# Patient Record
Sex: Male | Born: 1975 | Race: White | Hispanic: No | Marital: Married | Smoking: Never smoker
Health system: Southern US, Community
[De-identification: ages and names within clinical notes are randomized; demographics above are authoritative.]

## PROBLEM LIST (undated history)

## (undated) DIAGNOSIS — J90 Pleural effusion, not elsewhere classified: Secondary | ICD-10-CM

## (undated) HISTORY — DX: Pleural effusion, not elsewhere classified: J90

## (undated) HISTORY — PX: VASECTOMY: SHX75

---

## 2011-01-13 ENCOUNTER — Ambulatory Visit: Payer: Self-pay | Admitting: Family Medicine

## 2011-01-14 ENCOUNTER — Ambulatory Visit (INDEPENDENT_AMBULATORY_CARE_PROVIDER_SITE_OTHER): Payer: PRIVATE HEALTH INSURANCE | Admitting: Family Medicine

## 2011-01-14 ENCOUNTER — Encounter: Payer: Self-pay | Admitting: Family Medicine

## 2011-01-14 VITALS — BP 112/73 | Ht 74.0 in | Wt 215.0 lb

## 2011-01-14 DIAGNOSIS — M545 Low back pain: Secondary | ICD-10-CM

## 2011-01-14 NOTE — Patient Instructions (Signed)
Your exam is consistent with iliopsoas and lumbar spasms/strain Take tylenol for baseline pain relief (1-2 extra strength tabs 3x/day) Aleve 2 tablets twice a day with food as needed for pain and inflammation (if you do not have stomach or kidney issues). Stay as active as possible. Start physical therapy and go up to 6 weeks - ok to transition to home exercises sooner. Do exercises and stretches every day for 6 weeks then step down to doing them 3 times a week. Can consider massage, chiropractor and/or acupuncture but these have mixed results. Strengthening of low back muscles, abdominal musculature are key for long term pain relief.

## 2011-01-14 NOTE — Assessment & Plan Note (Signed)
2/2 iliopsoas spasms/strain and lumbar strain, less likely lumbar radiculopathy.  Symptoms much better currently than they were when this started 1 week ago.  Has never done PT - discussed this is the most important part of treatment to learn strengthening and stretching exercises to do daily, work on core strengthening, iliopsoas strengthening, and hamstring flexibility.  Naproxen for pain and inflammation, heat as needed.  See instructions for further.

## 2011-01-14 NOTE — Progress Notes (Signed)
  Subjective:    Patient ID: [name], male    DOB: 06-25-1975, 35 y.o.   MRN: [id number]  PCP: Larita Fife  HPI 35 yo M here for low back pain.  Patient reports having intermittent issues with low back past 7 years. No known injury to start all this. 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 Pain would flare up and resolve, seemed only brought about by prolonged standing at the time. 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 Then reports 1 week ago when getting up from a seated position felt a sharp pain in low back, deep in abdomen and in groin. Pain caused him to be bed ridden for 2 days. No numbness or tingling. No bowel or bladder dysfunction. Pain was worse with movement. Reproduced with back motions and felt this with doing iliopsoas stretches. Sitting for prolonged periods worsens pain in groin. No swelling or bruising. Had x-rays when in medical school and was told he had a '35 year old back' but physician did not go into specifics. Taking some nsaids for pain.  History reviewed. No pertinent past medical history.  No current outpatient prescriptions on file prior to visit.    History reviewed. No pertinent past surgical history.  No Known Allergies  History   Social History  . Marital Status: Married    Spouse Name: N/A    Number of Children: N/A  . Years of Education: N/A   Occupational History  . Not on file.   Social History Main Topics  . Smoking status: Never Smoker   . Smokeless tobacco: Not on file  . Alcohol Use: Not on file  . Drug Use: Not on file  . Sexually Active: Not on file   Other Topics Concern  . Not on file   Social History Narrative  . No narrative on file    Family History  Problem Relation Age of Onset  . Sudden death Neg Hx   . Heart attack Neg Hx   . Hyperlipidemia Neg Hx   . Hypertension Neg Hx   . Diabetes  Neg Hx     BP 112/73  Ht 6\' 2"  (1.88 m)  Wt 215 lb (97.523 kg)  BMI 27.60 kg/m2  Review of Systems See HPI above.    Objective:   Physical Exam Gen: NAD Back: No gross deformity, scoliosis.  Poor hamstring flexibility. TTP right lumbar paraspinal region.  No midline or bony TTP. FROM but can only flex to 30 degrees before getting pain in right low back, feeling uncomfortable. Strength LEs 5/5 all muscle groups except 5-/5 strength right hip flexion beyond 90 degrees.   2+ MSRs in patellar and achilles tendons, equal bilaterally. Negative SLRs. Sensation intact to light touch bilaterally. Negative logroll bilateral hips Negative fabers and piriformis stretches.    Assessment & Plan:  1. Low back pain - 2/2 iliopsoas spasms/strain and lumbar strain, less likely lumbar radiculopathy.  Symptoms much better currently than they were when this started 1 week ago.  Has never done PT - discussed this is the most important part of treatment to learn strengthening and stretching exercises to do daily, work on core strengthening, iliopsoas strengthening, and hamstring flexibility.  Naproxen for pain and inflammation, heat as needed.  See instructions for further.

## 2011-01-29 ENCOUNTER — Ambulatory Visit: Payer: PRIVATE HEALTH INSURANCE | Admitting: Physical Therapy

## 2013-09-22 ENCOUNTER — Ambulatory Visit (HOSPITAL_COMMUNITY)
Admission: RE | Admit: 2013-09-22 | Discharge: 2013-09-22 | Disposition: A | Discharge status: Home / Self Care | Payer: PRIVATE HEALTH INSURANCE | Source: Ambulatory Visit | Attending: Emergency Medicine | Admitting: Emergency Medicine

## 2013-09-22 ENCOUNTER — Emergency Department (HOSPITAL_COMMUNITY)
Admission: EM | Admit: 2013-09-22 | Discharge: 2013-09-22 | Discharge status: Against Medical Advice | Payer: PRIVATE HEALTH INSURANCE

## 2013-09-22 ENCOUNTER — Other Ambulatory Visit (HOSPITAL_COMMUNITY): Payer: Self-pay | Admitting: Emergency Medicine

## 2013-09-22 DIAGNOSIS — R05 Cough: Secondary | ICD-10-CM

## 2013-09-22 DIAGNOSIS — R059 Cough, unspecified: Secondary | ICD-10-CM

## 2013-09-22 DIAGNOSIS — R079 Chest pain, unspecified: Secondary | ICD-10-CM

## 2013-10-20 ENCOUNTER — Emergency Department (HOSPITAL_BASED_OUTPATIENT_CLINIC_OR_DEPARTMENT_OTHER): Payer: PRIVATE HEALTH INSURANCE

## 2013-10-20 ENCOUNTER — Encounter (HOSPITAL_BASED_OUTPATIENT_CLINIC_OR_DEPARTMENT_OTHER): Payer: Self-pay | Admitting: Emergency Medicine

## 2013-10-20 ENCOUNTER — Emergency Department (HOSPITAL_BASED_OUTPATIENT_CLINIC_OR_DEPARTMENT_OTHER)
Admission: EM | Admit: 2013-10-20 | Discharge: 2013-10-20 | Disposition: A | Discharge status: Home / Self Care | Payer: PRIVATE HEALTH INSURANCE | Attending: Emergency Medicine | Admitting: Emergency Medicine

## 2013-10-20 DIAGNOSIS — J90 Pleural effusion, not elsewhere classified: Secondary | ICD-10-CM | POA: Insufficient documentation

## 2013-10-20 LAB — HEPATIC FUNCTION PANEL
ALBUMIN: 3.6 g/dL (ref 3.5–5.2)
ALT: 18 U/L (ref 0–53)
AST: 17 U/L (ref 0–37)
Alkaline Phosphatase: 79 U/L (ref 39–117)
BILIRUBIN TOTAL: 0.6 mg/dL (ref 0.3–1.2)
Bilirubin, Direct: <0.2 mg/dL (ref 0.0–0.3)
TOTAL PROTEIN: 7.7 g/dL (ref 6.0–8.3)

## 2013-10-20 LAB — TROPONIN I

## 2013-10-20 LAB — CBC
HEMATOCRIT: 40.8 % (ref 39.0–52.0)
Hemoglobin: 13.9 g/dL (ref 13.0–17.0)
MCH: 30.1 pg (ref 26.0–34.0)
MCHC: 34.1 g/dL (ref 30.0–36.0)
MCV: 88.3 fL (ref 78.0–100.0)
PLATELETS: 255 10*3/uL (ref 150–400)
RBC: 4.62 MIL/uL (ref 4.22–5.81)
RDW: 11.8 % (ref 11.5–15.5)
WBC: 7.5 10*3/uL (ref 4.0–10.5)

## 2013-10-20 LAB — CK: Total CK: 70 U/L (ref 7–232)

## 2013-10-20 LAB — BASIC METABOLIC PANEL
BUN: 20 mg/dL (ref 6–23)
CHLORIDE: 101 meq/L (ref 96–112)
CO2: 25 meq/L (ref 19–32)
CREATININE: 1 mg/dL (ref 0.50–1.35)
Calcium: 9.5 mg/dL (ref 8.4–10.5)
GFR calc Af Amer: >90 mL/min (ref >90)
GFR calc non Af Amer: >90 mL/min (ref >90)
Glucose, Bld: 144 mg/dL — ABNORMAL HIGH (ref 70–99)
POTASSIUM: 4.1 meq/L (ref 3.7–5.3)
Sodium: 139 mEq/L (ref 137–147)

## 2013-10-20 LAB — D-DIMER, QUANTITATIVE: D-Dimer, Quant: 3.19 ug/mL-FEU — ABNORMAL HIGH (ref 0.00–0.48)

## 2013-10-20 MED ORDER — SODIUM CHLORIDE 0.9 % IV BOLUS (SEPSIS)
1000.0000 mL | Freq: Once | INTRAVENOUS | Status: AC
  Administered 2013-10-20: 1000 mL via INTRAVENOUS

## 2013-10-20 MED ORDER — ASPIRIN 81 MG PO CHEW
324.0000 mg | CHEWABLE_TABLET | Freq: Once | ORAL | Status: DC
  Filled 2013-10-20: qty 4

## 2013-10-20 MED ORDER — IOHEXOL 350 MG/ML SOLN
100.0000 mL | Freq: Once | INTRAVENOUS | Status: AC | PRN
  Administered 2013-10-20: 100 mL via INTRAVENOUS

## 2013-10-20 NOTE — ED Notes (Signed)
Pt brought his own EKG

## 2013-10-20 NOTE — ED Notes (Signed)
Room 873 was assigned to Patient-- transferred to [facility] by wife--- POV

## 2013-10-20 NOTE — ED Notes (Signed)
The doctor will call back on [phone]

## 2013-10-20 NOTE — ED Provider Notes (Signed)
CSN: [id number]     Arrival date & time 10/20/13  0909 History   First MD Initiated Contact with Patient 10/20/13 [phone]     Chief Complaint  Patient presents with  . Chest Pain     (Consider location/radiation/quality/duration/timing/severity/associated sxs/prior Treatment) HPI Comments: Patient is a 38 year old male with no significant past medical history who presents with a one-month history of pleuritic, right-sided chest pain. The pain is sharp in nature and causes him to feel short of breath. He has no prior cardiac history and actually completed an Ironman competition nearly 3 weeks ago. His pain had started prior to this race, subsided during the time period around which the race was run, and return shortly thereafter. He states his creatinine was mildly elevated after the race and believes he may have had some degree of rhabdomyolysis.  Patient is a 38 y.o. male presenting with chest pain. The history is provided by the patient.  Chest Pain Pain location:  R chest Pain quality: sharp   Pain radiates to:  Does not radiate Pain radiates to the back: no   Pain severity:  Moderate Onset quality:  Gradual Duration:  1 month Timing:  Intermittent Progression:  Worsening Chronicity:  New Context: breathing and movement   Relieved by:  Nothing Worsened by:  Deep breathing, movement and certain positions Ineffective treatments:  None tried   No past medical history on file. No past surgical history on file. Family History  Problem Relation Age of Onset  . Sudden death Neg Hx   . Heart attack Neg Hx   . Hyperlipidemia Neg Hx   . Hypertension Neg Hx   . Diabetes Neg Hx    History  Substance Use Topics  . Smoking status: Never Smoker   . Smokeless tobacco: Not on file  . Alcohol Use: Not on file    Review of Systems  Cardiovascular: Positive for chest pain.      Allergies  Review of patient's allergies indicates no known allergies.  Home Medications   Prior to  Admission medications   Not on File   BP 131/71  Pulse 112  Temp(Src) 98.8 F (37.1 C) (Oral)  Resp 16  SpO2 97% Physical Exam  Nursing note and vitals reviewed. Constitutional: He is oriented to person, place, and time. He appears well-developed and well-nourished. No distress.  HENT:  Head: Normocephalic and atraumatic.  Mouth/Throat: Oropharynx is clear and moist.  Neck: Normal range of motion. Neck supple.  Cardiovascular: Normal rate and regular rhythm.   Pulmonary/Chest: Effort normal. No respiratory distress. He has no wheezes.  Breath sounds are slightly diminished on the right.  Abdominal: Soft. Bowel sounds are normal.  Musculoskeletal: Normal range of motion. He exhibits no edema.  Neurological: He is alert and oriented to person, place, and time.  Skin: Skin is warm and dry. He is not diaphoretic.    ED Course  Procedures (including critical care time) Labs Review Labs Reviewed  CBC  BASIC METABOLIC PANEL  TROPONIN I  D-DIMER, QUANTITATIVE  HEPATIC FUNCTION PANEL    Imaging Review No results found.   Date: 10/20/2013  Rate: 77  Rhythm: normal sinus rhythm  QRS Axis: normal  Intervals: normal  ST/T Wave abnormalities: normal  Conduction Disutrbances:none  Narrative Interpretation:   Old EKG Reviewed: none available    MDM   Final diagnoses:  None    Workup reveals an elevated d-dimer. CT angiogram the chest was obtained which revealed a large pleural effusion with mildly  enlarged hilar lymph nodes. The concern for occult malignancy was expressed by the radiologist. His recommendations were for cytology studies to be obtained by thoracentesis. As the patient's insurance is through [facility], arrangements will be made for admission there. I've spoken with Dr. Andrey Campanile who agrees to accept the patient in transfer. He will be transferred there by private auto.    Geoffery Lyons, MD 10/20/13 [phone]

## 2013-10-20 NOTE — ED Notes (Addendum)
Pt having right sided rib since May 5th.  Pain increases with movement and deep breath.  Denies N/V.  Some sweating.  Pt states he is unable to sleep.  No recent travel.

## 2013-10-24 ENCOUNTER — Ambulatory Visit (INDEPENDENT_AMBULATORY_CARE_PROVIDER_SITE_OTHER): Payer: PRIVATE HEALTH INSURANCE | Admitting: Pulmonary Disease

## 2013-10-24 ENCOUNTER — Ambulatory Visit (INDEPENDENT_AMBULATORY_CARE_PROVIDER_SITE_OTHER)
Admission: RE | Admit: 2013-10-24 | Discharge: 2013-10-24 | Disposition: A | Discharge status: Home / Self Care | Payer: PRIVATE HEALTH INSURANCE | Source: Ambulatory Visit | Attending: Pulmonary Disease | Admitting: Pulmonary Disease

## 2013-10-24 ENCOUNTER — Encounter (INDEPENDENT_AMBULATORY_CARE_PROVIDER_SITE_OTHER): Payer: Self-pay

## 2013-10-24 ENCOUNTER — Other Ambulatory Visit (INDEPENDENT_AMBULATORY_CARE_PROVIDER_SITE_OTHER): Payer: PRIVATE HEALTH INSURANCE

## 2013-10-24 ENCOUNTER — Other Ambulatory Visit: Payer: Self-pay | Admitting: Pulmonary Disease

## 2013-10-24 ENCOUNTER — Encounter: Payer: Self-pay | Admitting: Pulmonary Disease

## 2013-10-24 VITALS — BP 132/60 | HR 87 | Temp 97.5°F | Ht 74.0 in | Wt 212.6 lb

## 2013-10-24 DIAGNOSIS — J90 Pleural effusion, not elsewhere classified: Secondary | ICD-10-CM

## 2013-10-24 LAB — CBC WITH DIFFERENTIAL/PLATELET
BASOS PCT: 0 % (ref 0.0–3.0)
Basophils Absolute: 0 10*3/uL (ref 0.0–0.1)
Eosinophils Absolute: 0.1 10*3/uL (ref 0.0–0.7)
Eosinophils Relative: 1.6 % (ref 0.0–5.0)
HEMATOCRIT: 41.8 % (ref 39.0–52.0)
HEMOGLOBIN: 13.9 g/dL (ref 13.0–17.0)
LYMPHS ABS: 0.9 10*3/uL (ref 0.7–4.0)
Lymphocytes Relative: 10.1 % — ABNORMAL LOW (ref 12.0–46.0)
MCHC: 33.1 g/dL (ref 30.0–36.0)
MCV: 87.6 fl (ref 78.0–100.0)
MONO ABS: 1 10*3/uL (ref 0.1–1.0)
MONOS PCT: 11 % (ref 3.0–12.0)
NEUTROS ABS: 6.9 10*3/uL (ref 1.4–7.7)
Neutrophils Relative %: 77.3 % — ABNORMAL HIGH (ref 43.0–77.0)
PLATELETS: 380 10*3/uL (ref 150.0–400.0)
RBC: 4.78 Mil/uL (ref 4.22–5.81)
RDW: 11.8 % (ref 11.5–15.5)
WBC: 9 10*3/uL (ref 4.0–10.5)

## 2013-10-24 LAB — SEDIMENTATION RATE: Sed Rate: 81 mm/hr — ABNORMAL HIGH (ref 0–22)

## 2013-10-24 MED ORDER — LEVOFLOXACIN 500 MG PO TABS: 500.0000 mg | ORAL_TABLET | Freq: Every day | ORAL | Status: DC

## 2013-10-24 NOTE — Assessment & Plan Note (Signed)
Favor viral pleuritis but bacterial possible - sudden onset and monocytic predominance in pleural fluid fevers viral, however loculation and chills raises concern for bacterial process. Normal chest x-ray month ago and absence of leukocytosis is reassuring. Levaquin 500 mg daily x 7 days CXR today CBC with diff, ESR, ANA, RA factor today Let me know cytology results once available  Based on these results, we will repeat testing later this week as required. If he develops persistent fever or leukocytosis, we'll have to consider referral to thoracic surgery for drainage. If effusion is persistent on today's chest x-ray, can consider repeat thoracenteses.

## 2013-10-24 NOTE — Patient Instructions (Addendum)
Favor viral pleuritis but bacterial possible Levaquin 500 mg daily x 7 days CXR today CBC with diff, ESR, ANA, RA factor today Let me know cytology results once available  Based on these results, we will repeat testing later this week as required

## 2013-10-24 NOTE — Progress Notes (Signed)
Subjective:    Patient ID: [name], male    DOB: 04-17-1976, 38 y.o.   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 He was training for his first triathlon. He developed sudden onset right-sided pleuritic chest pain in the first week of May associated with a dry cough and scant amount of blood-tinged sputum. Chest x-ray on 09/22/13 did not show any infiltrate or effusion. His symptoms resolved and he was able to complete the triathlon in [place] in the last week of May. About 6 days after this, pleuritic chest pain returned and he developed some chills and night sweats.ED evaluation showed a white blood cell count of 7.5, hemoglobin of 13.9 and positive d-dimer. CT angiogram did not show any evidence of pulmonary embolism but showed a new large right-sided pleural effusion with atelectasis of the right lower lobe. He was transferred and admitted to [facility]. Further testing showed ESR of 53, he underwent thoracenteses twice. Initial was 55 mL of serous sanguinous fluid showing 5700 white cells with 77% segs and a protein of 5.0. Subsequent thoracenteses by IR removed 1 L of amber yellow fluid with 2400 white cells, 53% polys and 47% monos. Cytology is pending. CT abdomen of the pelvis showed small loculated effusion with areas of gas likely related to the recent thoracenteses procedure.  He now complains of dry cough, pain is tolerable with Motrin round-the-clock. Reports 2 episodes of chills but no feverthat was measured. He does report being QuantiFERON negative about one year ago. He denies prior history of venous thromboembolism, does report superficial thrombosis and varicosities in his left leg. No weight loss or sick contacts. He has missed a few days of work and is eager to return   Past Medical History  Diagnosis Date  . Pleural effusion     Past Surgical History  Procedure Laterality Date  . Vasectomy        2012    No Known Allergies  History   Social History  . Marital Status: Married    Spouse Name: N/A    Number of Children: N/A  . Years of Education: N/A   Occupational History  . Not on file.   Social History Main Topics  . Smoking status: Never Smoker   . Smokeless tobacco: Not on file  . Alcohol Use: No  . Drug Use: No  . Sexual Activity: Not on file   Other Topics Concern  . Not on file   Social History Narrative  . No narrative on file    Family History  Problem Relation Age of Onset  . Sudden death Neg Hx   . Heart attack Neg Hx   . Hyperlipidemia Neg Hx   . Hypertension Neg Hx   . Diabetes Neg Hx   . Breast cancer Maternal Grandmother   . Liver disease Paternal Grandfather       Review of Systems positive as above  Constitutional: negative for anorexia, fevers  Eyes: negative for irritation, redness and visual disturbance  Ears, nose, mouth, throat, and face: negative for earaches, epistaxis, nasal congestion and sore throat  Respiratory: negative for  dyspnea on exertion, sputum and wheezing  Cardiovascular: negative for pain, dyspnea, lower extremity edema, orthopnea, palpitations and syncope  Gastrointestinal: negative for abdominal pain, constipation, diarrhea, melena, nausea and vomiting  Genitourinary:negative for dysuria, frequency and hematuria  Hematologic/lymphatic: negative for bleeding, easy bruising and lymphadenopathy  Musculoskeletal:negative for arthralgias, muscle weakness  and stiff joints  Neurological: negative for coordination problems, gait problems, headaches and weakness  Endocrine: negative for diabetic symptoms including polydipsia, polyuria and weight loss     Objective:   Physical Exam  Gen. Pleasant, well-nourished, in no distress, normal affect ENT - no lesions, no post nasal drip Neck: No JVD, no thyromegaly, no carotid bruits Lungs: no use of accessory muscles, no dullness to percussion, Decreased right  infrascapular without rales or rhonchi  Cardiovascular: Rhythm regular, heart sounds  normal, no murmurs, no peripheral edema Abdomen: soft and non-tender, no hepatosplenomegaly, BS normal. Musculoskeletal: No deformities, no cyanosis or clubbing, prominent veins left thigh Neuro:  alert, non focal Skin:  Warm, no lesions/ rash       Assessment & Plan:

## 2013-10-26 ENCOUNTER — Ambulatory Visit (HOSPITAL_COMMUNITY)
Admission: RE | Admit: 2013-10-26 | Discharge: 2013-10-26 | Disposition: A | Discharge status: Home / Self Care | Payer: PRIVATE HEALTH INSURANCE | Source: Ambulatory Visit | Attending: Radiology | Admitting: Radiology

## 2013-10-26 ENCOUNTER — Ambulatory Visit (HOSPITAL_COMMUNITY)
Admission: RE | Admit: 2013-10-26 | Discharge: 2013-10-26 | Disposition: A | Discharge status: Home / Self Care | Payer: PRIVATE HEALTH INSURANCE | Source: Ambulatory Visit | Attending: Pulmonary Disease | Admitting: Pulmonary Disease

## 2013-10-26 ENCOUNTER — Other Ambulatory Visit: Payer: Self-pay | Admitting: Pulmonary Disease

## 2013-10-26 DIAGNOSIS — J90 Pleural effusion, not elsewhere classified: Secondary | ICD-10-CM

## 2013-10-26 DIAGNOSIS — R0989 Other specified symptoms and signs involving the circulatory and respiratory systems: Secondary | ICD-10-CM | POA: Insufficient documentation

## 2013-10-26 DIAGNOSIS — R0609 Other forms of dyspnea: Secondary | ICD-10-CM | POA: Insufficient documentation

## 2013-10-26 LAB — BODY FLUID CELL COUNT WITH DIFFERENTIAL
Lymphs, Fluid: 3 %
MONOCYTE-MACROPHAGE-SEROUS FLUID: 41 % — AB (ref 50–90)
NEUTROPHIL FLUID: 56 % — AB (ref 0–25)
Total Nucleated Cell Count, Fluid: 9205 cu mm — ABNORMAL HIGH (ref 0–1000)

## 2013-10-26 LAB — GLUCOSE, SEROUS FLUID: Glucose, Fluid: <20 mg/dL

## 2013-10-26 NOTE — Procedures (Signed)
Severely loculated right pleural effusion Successful US guided right thoracentesis. Yielded 7cc of hazy blood tinged fluid. Pt tolerated procedure well. No immediate complications.  Specimen was sent for labs. CXR ordered.  Brayton El PA-C 10/26/2013 4:01 PM

## 2013-10-27 ENCOUNTER — Encounter (HOSPITAL_COMMUNITY): Payer: Self-pay | Admitting: Pharmacy Technician

## 2013-10-27 ENCOUNTER — Encounter: Payer: Self-pay | Admitting: Cardiothoracic Surgery

## 2013-10-27 ENCOUNTER — Encounter (HOSPITAL_COMMUNITY): Payer: Self-pay

## 2013-10-27 ENCOUNTER — Other Ambulatory Visit: Payer: Self-pay

## 2013-10-27 ENCOUNTER — Encounter (HOSPITAL_COMMUNITY)
Admission: RE | Admit: 2013-10-27 | Discharge: 2013-10-27 | Disposition: A | Discharge status: Home / Self Care | Payer: PRIVATE HEALTH INSURANCE | Source: Ambulatory Visit | Attending: Cardiothoracic Surgery | Admitting: Cardiothoracic Surgery

## 2013-10-27 ENCOUNTER — Other Ambulatory Visit (HOSPITAL_COMMUNITY): Payer: Self-pay | Admitting: *Deleted

## 2013-10-27 ENCOUNTER — Institutional Professional Consult (permissible substitution) (INDEPENDENT_AMBULATORY_CARE_PROVIDER_SITE_OTHER): Payer: PRIVATE HEALTH INSURANCE | Admitting: Cardiothoracic Surgery

## 2013-10-27 VITALS — BP 115/72 | HR 78 | Resp 20 | Ht 74.0 in | Wt 212.0 lb

## 2013-10-27 VITALS — BP 105/71 | HR 70 | Temp 97.6°F | Resp 15 | Ht 74.0 in | Wt 208.8 lb

## 2013-10-27 DIAGNOSIS — J90 Pleural effusion, not elsewhere classified: Secondary | ICD-10-CM

## 2013-10-27 LAB — CBC
HCT: 41.4 % (ref 39.0–52.0)
Hemoglobin: 13.9 g/dL (ref 13.0–17.0)
MCH: 28.9 pg (ref 26.0–34.0)
MCHC: 33.6 g/dL (ref 30.0–36.0)
MCV: 86.1 fL (ref 78.0–100.0)
Platelets: 338 10*3/uL (ref 150–400)
RBC: 4.81 MIL/uL (ref 4.22–5.81)
RDW: 11.8 % (ref 11.5–15.5)
WBC: 8.4 10*3/uL (ref 4.0–10.5)

## 2013-10-27 LAB — TYPE AND SCREEN
ABO/RH(D): A POS
Antibody Screen: NEGATIVE

## 2013-10-27 LAB — URINALYSIS, ROUTINE W REFLEX MICROSCOPIC
Glucose, UA: NEGATIVE mg/dL
Hgb urine dipstick: NEGATIVE
Ketones, ur: NEGATIVE mg/dL
Leukocytes, UA: NEGATIVE
Nitrite: NEGATIVE
Protein, ur: NEGATIVE mg/dL
Specific Gravity, Urine: 1.028 (ref 1.005–1.030)
Urobilinogen, UA: 1 mg/dL (ref 0.0–1.0)
pH: 6 (ref 5.0–8.0)

## 2013-10-27 LAB — PATHOLOGIST SMEAR REVIEW

## 2013-10-27 LAB — BLOOD GAS, ARTERIAL
Acid-base deficit: 1.1 mmol/L (ref 0.0–2.0)
Bicarbonate: 22.7 mEq/L (ref 20.0–24.0)
Drawn by: 344381
O2 Saturation: 98.2 %
Patient temperature: 98.6
TCO2: 23.8 mmol/L (ref 0–100)
pCO2 arterial: 35.1 mmHg (ref 35.0–45.0)
pH, Arterial: 7.426 (ref 7.350–7.450)
pO2, Arterial: 92.7 mmHg (ref 80.0–100.0)

## 2013-10-27 LAB — COMPREHENSIVE METABOLIC PANEL
ALT: 14 U/L (ref 0–53)
AST: 17 U/L (ref 0–37)
Albumin: 2.9 g/dL — ABNORMAL LOW (ref 3.5–5.2)
Alkaline Phosphatase: 87 U/L (ref 39–117)
BUN: 17 mg/dL (ref 6–23)
CO2: 17 mEq/L — ABNORMAL LOW (ref 19–32)
Calcium: 9.4 mg/dL (ref 8.4–10.5)
Chloride: 102 mEq/L (ref 96–112)
Creatinine, Ser: 0.92 mg/dL (ref 0.50–1.35)
GFR calc Af Amer: >90 mL/min (ref >90)
GFR calc non Af Amer: >90 mL/min (ref >90)
Glucose, Bld: 96 mg/dL (ref 70–99)
Potassium: 4.3 mEq/L (ref 3.7–5.3)
Sodium: 138 mEq/L (ref 137–147)
Total Bilirubin: 0.3 mg/dL (ref 0.3–1.2)
Total Protein: 7.7 g/dL (ref 6.0–8.3)

## 2013-10-27 LAB — SURGICAL PCR SCREEN
MRSA, PCR: NEGATIVE
Staphylococcus aureus: NEGATIVE

## 2013-10-27 LAB — PROTIME-INR
INR: 1.2 (ref 0.00–1.49)
Prothrombin Time: 14.9 seconds (ref 11.6–15.2)

## 2013-10-27 LAB — ABO/RH: ABO/RH(D): A POS

## 2013-10-27 LAB — APTT: aPTT: 29 seconds (ref 24–37)

## 2013-10-27 MED ORDER — DEXTROSE 5 % IV SOLN
1.5000 g | INTRAVENOUS | Status: AC
  Administered 2013-10-28: 1.5 g via INTRAVENOUS
  Filled 2013-10-27: qty 1.5

## 2013-10-27 NOTE — Pre-Procedure Instructions (Signed)
[name]  10/27/2013   Your procedure is scheduled on:  Friday, October 28, 2013 at 7:30 AM.   Report to [facility] Entrance "A" Admitting Office at 5:30 AM.   Call this number if you have problems the morning of surgery: [phone]   Remember:   Do not eat food or drink liquids after midnight tonight.   Take these medicines the morning of surgery with A SIP OF WATER: Zyrtec - if needed, Zantac - if needed.   Do not wear jewelry.  Do not wear lotions, powders, or cologne. You may NOT wear deodorant.  Men may shave face and neck.  Do not bring valuables to the hospital.  [facility] is not responsible                  for any belongings or valuables.               Contacts, dentures or bridgework may not be worn into surgery.  Leave suitcase in the car. After surgery it may be brought to your room.  For patients admitted to the hospital, discharge time is determined by your                treatment team.     Special Instructions: [place] - Preparing for Surgery  Before surgery, you can play an important role.  Because skin is not sterile, your skin needs to be as free of germs as possible.  You can reduce the number of germs on you skin by washing with CHG (chlorahexidine gluconate) soap before surgery.  CHG is an antiseptic cleaner which kills germs and bonds with the skin to continue killing germs even after washing.  Please DO NOT use if you have an allergy to CHG or antibacterial soaps.  If your skin becomes reddened/irritated stop using the CHG and inform your nurse when you arrive at Short Stay.  Do not shave (including legs and underarms) for at least 48 hours prior to the first CHG shower.  You may shave your face.  Please follow these instructions carefully:   1.  Shower with CHG Soap the night before surgery and the                                morning of Surgery.  2.  If you choose to wash your hair, wash your hair first as usual with your       normal  shampoo.  3.  After you shampoo, rinse your hair and body thoroughly to remove the                      Shampoo.  4.  Use CHG as you would any other liquid soap.  You can apply chg directly       to the skin and wash gently with scrungie or a clean washcloth.  5.  Apply the CHG Soap to your body ONLY FROM THE NECK DOWN.        Do not use on open wounds or open sores.  Avoid contact with your eyes, ears, mouth and genitals (private parts).  Wash genitals (private parts) with your normal soap.  6.  Wash thoroughly, paying special attention to the area where your surgery        will be performed.  7.  Thoroughly rinse your body with warm water from the neck down.  8.  DO NOT shower/wash with your normal soap after using and rinsing off       the CHG Soap.  9.  Pat yourself dry with a clean towel.            10.  Wear clean pajamas.            11.  Place clean sheets on your bed the night of your first shower and do not        sleep with pets.  Day of Surgery  Do not apply any lotions/deodorants the morning of surgery.  Please wear clean clothes to the hospital/surgery center.     Please read over the following fact sheets that you were given: Pain Booklet, Coughing and Deep Breathing, Blood Transfusion Information, MRSA Information and Surgical Site Infection Prevention

## 2013-10-27 NOTE — Progress Notes (Signed)
Pt stated that Dr. Donata Clay told him that he would not need the CXR today, had a one view yesterday and a 2 view on the 8th.

## 2013-10-27 NOTE — Progress Notes (Signed)
PCP is Eartha InchBADGER,MICHAEL C, MD Referring Provider is Oretha MilchAlva, Rakesh V, MD  Chief Complaint  Patient presents with  . 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 The patient developed symptoms of right pleuritic chest pain, dry cough, minimal hemoptysis, shortness of breath and night sweats in early May. He was training for a Iron[place] triathlon so he had a chest x-ray which was normal and blood work which apparently was normal. He was given a course of azithromycin. [place]. He noted no injury is or aspiration of the [place] water. In the aid tentt after completing the race his creatinine was 2.5 and he was given IV fluids. He noted some dark urine 1-2 days following the race which cleared. After returning from the Ironman competition his symptoms returned and were worse. He presented to the ED with shortness of breath and a CT scan performed to rule out PE showed no PE-however he had a large complex right pleural effusion with some reactive mediastinal nodes. [facility] He had a thoracentesis which removed 1 L of amber fluid cytology negative for malignancy but with inflammatory cells. A tuberculin skin test was placed and was apparently negative. Cultures of fluid were negative. CT of the abdomen was unremarkable at the outside hospital. His inflammatory markers were elevated-sedimentation rate and C-reactive protein. No pericardial effusion. His LFTs are unremarkable. Electrolytes normal. White count normal.  He returned back home and has  had recurrent symptoms. He was seen by pulmonary medicine a repeat x-ray showed persistent effusion. An attempt at repeat thoracentesis was  unsuccessful due to the loculated nature the fluid. Thoracic surgical evaluation was requested for VATS drainage of the loculated effusion.  The patient persists in having pleuritic pain night sweats. He has not lost weight. He denies prior injuries to his chest or previous pulmonary infections. No active dental complaints No history of cardiac disease   Past Medical History  Diagnosis Date  . Pleural effusion     Past Surgical History  Procedure Laterality Date  . Vasectomy      2012    Family History  Problem Relation Age of Onset  . Sudden death Neg Hx   . Heart attack Neg Hx   . Hyperlipidemia Neg Hx   . Hypertension Neg Hx   . Diabetes Neg Hx   . Breast cancer Maternal Grandmother   . Liver disease Paternal Grandfather     Social History History  Substance Use Topics  . Smoking status: Never Smoker   . Smokeless tobacco: Not on file  . Alcohol Use: No    Current Outpatient Prescriptions  Medication Sig Dispense Refill  . cetirizine (ZYRTEC) 10 MG tablet Take 10 mg by mouth daily as needed for allergies.       Marland Kitchen. ibuprofen (ADVIL,MOTRIN) 200 MG tablet Take 200-800 mg by mouth every 6 (six) hours as needed (for pain).      Marland Kitchen. levofloxacin (LEVAQUIN) 500 MG tablet Take 1 tablet (500 mg total) by mouth daily.  7 tablet  0  . Ranitidine HCl (ZANTAC PO) Take 1 tablet by mouth daily as needed (for heart burn).       . zolpidem (AMBIEN) 10 MG tablet Take 10 mg by mouth  at bedtime as needed for sleep.       No current facility-administered medications for this visit.    No Known Allergies  Review of Systems Currently patient is completing course of Levaquin He has family history of varicose veins which he also has bilaterally No history of heavy weight training prior to his Ironman competition  No history of artificial supplements in preparation for the race   he is right-hand dominant   BP 115/72  Pulse 78  Resp 20  Ht 6\' 2"  (1.88 m)  Wt 212 lb (96.163 kg)   BMI 27.21 kg/m2  SpO2 96% Physical Exam  38 year old male anxious but in no acute distress   HCT normocephalic dentition good Neck without JVD adenopathy or mass Thorax without deformity breath sounds diminished at right base Cardiac exam regular rhythm without murmur or gallop Abdomen soft nontender without organomegaly Extremities with superficial varicosities bilaterally no tenderness no cyanosis no edema Neuro no focal motor deficit  Diagnostic Tests:  CT scan reviewed   last chest x-ray reviewed  Impression:  inflammatory loculated right pleural effusion-probable viral. Doubt TB pleural disease. Persistent symptoms despite prolonged course of oral antibiotic therapy.   Plan  We'll proceed with bronchoscopy and right VATS to clean out The pleural space and obtain tissue for cultures and pathology. I discussed the procedure in detail the patient including location the surgical incisions, the use of  general anesthesia, and expected postoperative recovery. We will request from anesthesia epidural catheter for postoperative pain management. He understands and agrees to proceed with surgery

## 2013-10-28 ENCOUNTER — Inpatient Hospital Stay (HOSPITAL_COMMUNITY)
Admission: RE | Admit: 2013-10-28 | Discharge: 2013-11-01 | DRG: 165 | Disposition: A | Discharge status: Home / Self Care | Payer: PRIVATE HEALTH INSURANCE | Source: Ambulatory Visit | Attending: Cardiothoracic Surgery | Admitting: Cardiothoracic Surgery

## 2013-10-28 ENCOUNTER — Encounter (HOSPITAL_COMMUNITY): Payer: PRIVATE HEALTH INSURANCE | Admitting: Anesthesiology

## 2013-10-28 ENCOUNTER — Encounter (HOSPITAL_BASED_OUTPATIENT_CLINIC_OR_DEPARTMENT_OTHER): Payer: Self-pay | Admitting: Anesthesiology

## 2013-10-28 ENCOUNTER — Inpatient Hospital Stay (HOSPITAL_COMMUNITY): Payer: PRIVATE HEALTH INSURANCE | Admitting: Anesthesiology

## 2013-10-28 ENCOUNTER — Encounter (HOSPITAL_COMMUNITY)
Admission: RE | Disposition: A | Discharge status: Home / Self Care | Payer: Self-pay | Source: Ambulatory Visit | Attending: Cardiothoracic Surgery

## 2013-10-28 ENCOUNTER — Inpatient Hospital Stay (HOSPITAL_COMMUNITY): Payer: PRIVATE HEALTH INSURANCE

## 2013-10-28 DIAGNOSIS — T4275XA Adverse effect of unspecified antiepileptic and sedative-hypnotic drugs, initial encounter: Secondary | ICD-10-CM | POA: Diagnosis not present

## 2013-10-28 DIAGNOSIS — J90 Pleural effusion, not elsewhere classified: Secondary | ICD-10-CM | POA: Diagnosis present

## 2013-10-28 DIAGNOSIS — J869 Pyothorax without fistula: Principal | ICD-10-CM | POA: Diagnosis present

## 2013-10-28 DIAGNOSIS — I498 Other specified cardiac arrhythmias: Secondary | ICD-10-CM | POA: Diagnosis present

## 2013-10-28 DIAGNOSIS — K5909 Other constipation: Secondary | ICD-10-CM | POA: Diagnosis not present

## 2013-10-28 DIAGNOSIS — Z01812 Encounter for preprocedural laboratory examination: Secondary | ICD-10-CM

## 2013-10-28 HISTORY — PX: VIDEO BRONCHOSCOPY: SHX5072

## 2013-10-28 HISTORY — PX: WEDGE RESECTION: SHX5070

## 2013-10-28 HISTORY — PX: VIDEO ASSISTED THORACOSCOPY (VATS)/DECORTICATION: SHX6171

## 2013-10-28 LAB — GLUCOSE, CAPILLARY: Glucose-Capillary: 112 mg/dL — ABNORMAL HIGH (ref 70–99)

## 2013-10-28 SURGERY — VIDEO ASSISTED THORACOSCOPY (VATS)/DECORTICATION
Anesthesia: Epidural | Site: Chest | Laterality: Right

## 2013-10-28 MED ORDER — MIDAZOLAM HCL 5 MG/5ML IJ SOLN
INTRAMUSCULAR | Status: DC | PRN
  Administered 2013-10-28 (×2): 1 mg via INTRAVENOUS

## 2013-10-28 MED ORDER — NEOSTIGMINE METHYLSULFATE 10 MG/10ML IV SOLN
INTRAVENOUS | Status: AC
  Filled 2013-10-28: qty 1

## 2013-10-28 MED ORDER — ONDANSETRON HCL 4 MG/2ML IJ SOLN
4.0000 mg | Freq: Four times a day (QID) | INTRAMUSCULAR | Status: DC | PRN
  Administered 2013-10-30: 4 mg via INTRAVENOUS
  Filled 2013-10-28: qty 2

## 2013-10-28 MED ORDER — GLYCOPYRROLATE 0.2 MG/ML IJ SOLN
INTRAMUSCULAR | Status: AC
  Filled 2013-10-28: qty 2

## 2013-10-28 MED ORDER — PROMETHAZINE HCL 25 MG/ML IJ SOLN
INTRAMUSCULAR | Status: AC
  Filled 2013-10-28: qty 1

## 2013-10-28 MED ORDER — VANCOMYCIN HCL IN DEXTROSE 1-5 GM/200ML-% IV SOLN: 1000.0000 mg | INTRAVENOUS | Status: DC

## 2013-10-28 MED ORDER — ROCURONIUM BROMIDE 50 MG/5ML IV SOLN
INTRAVENOUS | Status: AC
  Filled 2013-10-28: qty 1

## 2013-10-28 MED ORDER — KETOROLAC TROMETHAMINE 30 MG/ML IJ SOLN
15.0000 mg | Freq: Once | INTRAMUSCULAR | Status: AC | PRN
  Administered 2013-10-28: 30 mg via INTRAVENOUS

## 2013-10-28 MED ORDER — ONDANSETRON HCL 4 MG/2ML IJ SOLN
INTRAMUSCULAR | Status: AC
  Filled 2013-10-28: qty 2

## 2013-10-28 MED ORDER — PIPERACILLIN-TAZOBACTAM 3.375 G IVPB
3.3750 g | INTRAVENOUS | Status: DC
  Filled 2013-10-28: qty 50

## 2013-10-28 MED ORDER — LIDOCAINE HCL (CARDIAC) 20 MG/ML IV SOLN
INTRAVENOUS | Status: DC | PRN
  Administered 2013-10-28: 60 mg via INTRAVENOUS

## 2013-10-28 MED ORDER — DEXAMETHASONE SODIUM PHOSPHATE 4 MG/ML IJ SOLN
INTRAMUSCULAR | Status: AC
  Filled 2013-10-28: qty 2

## 2013-10-28 MED ORDER — SUCCINYLCHOLINE CHLORIDE 20 MG/ML IJ SOLN
INTRAMUSCULAR | Status: AC
  Filled 2013-10-28: qty 1

## 2013-10-28 MED ORDER — FENTANYL CITRATE 0.05 MG/ML IJ SOLN
25.0000 ug | INTRAMUSCULAR | Status: DC | PRN
  Administered 2013-10-28: 50 ug via INTRAVENOUS
  Filled 2013-10-28: qty 2

## 2013-10-28 MED ORDER — LACTATED RINGERS IV SOLN
INTRAVENOUS | Status: DC | PRN
  Administered 2013-10-28: 07:00:00 via INTRAVENOUS

## 2013-10-28 MED ORDER — ONDANSETRON HCL 4 MG/2ML IJ SOLN
INTRAMUSCULAR | Status: DC | PRN
  Administered 2013-10-28: 4 mg via INTRAVENOUS

## 2013-10-28 MED ORDER — HYDROMORPHONE HCL PF 1 MG/ML IJ SOLN
0.2500 mg | INTRAMUSCULAR | Status: DC | PRN
  Administered 2013-10-28 (×3): 0.5 mg via INTRAVENOUS

## 2013-10-28 MED ORDER — INSULIN ASPART 100 UNIT/ML ~~LOC~~ SOLN: 0.0000 [IU] | Freq: Four times a day (QID) | SUBCUTANEOUS | Status: DC

## 2013-10-28 MED ORDER — OXYCODONE HCL 5 MG PO TABS: 5.0000 mg | ORAL_TABLET | Freq: Once | ORAL | Status: DC | PRN

## 2013-10-28 MED ORDER — ROPIVACAINE HCL 5 MG/ML IJ SOLN
INTRAMUSCULAR | Status: DC | PRN
  Administered 2013-10-28: 6 mL via EPIDURAL

## 2013-10-28 MED ORDER — LUNG SURGERY BOOK
Freq: Once | Status: AC
  Administered 2013-10-28: 20:00:00
  Filled 2013-10-28: qty 1

## 2013-10-28 MED ORDER — ROPIVACAINE HCL 2 MG/ML IJ SOLN
INTRAMUSCULAR | Status: DC | PRN
  Administered 2013-10-28: 8 mL/h via EPIDURAL

## 2013-10-28 MED ORDER — DEXTROSE-NACL 5-0.9 % IV SOLN
INTRAVENOUS | Status: DC
  Administered 2013-10-28: 125 mL/h via INTRAVENOUS
  Administered 2013-10-29: 12:00:00 via INTRAVENOUS

## 2013-10-28 MED ORDER — ACETAMINOPHEN 160 MG/5ML PO SOLN
325.0000 mg | ORAL | Status: DC | PRN
  Filled 2013-10-28: qty 20.3

## 2013-10-28 MED ORDER — LACTATED RINGERS IV SOLN
INTRAVENOUS | Status: DC | PRN
  Administered 2013-10-28 (×2): via INTRAVENOUS

## 2013-10-28 MED ORDER — PANTOPRAZOLE SODIUM 40 MG PO TBEC
40.0000 mg | DELAYED_RELEASE_TABLET | Freq: Every day | ORAL | Status: DC
  Administered 2013-10-29 – 2013-10-31 (×3): 40 mg via ORAL
  Filled 2013-10-28 (×4): qty 1

## 2013-10-28 MED ORDER — OXYCODONE HCL 5 MG/5ML PO SOLN: 5.0000 mg | Freq: Once | ORAL | Status: DC | PRN

## 2013-10-28 MED ORDER — PIPERACILLIN-TAZOBACTAM 3.375 G IVPB
3.3750 g | Freq: Three times a day (TID) | INTRAVENOUS | Status: DC
  Administered 2013-10-28 – 2013-11-01 (×11): 3.375 g via INTRAVENOUS
  Filled 2013-10-28 (×17): qty 50

## 2013-10-28 MED ORDER — DEXAMETHASONE SODIUM PHOSPHATE 4 MG/ML IJ SOLN
INTRAMUSCULAR | Status: DC | PRN
  Administered 2013-10-28: 8 mg via INTRAVENOUS

## 2013-10-28 MED ORDER — LIDOCAINE-EPINEPHRINE (PF) 1.5 %-1:200000 IJ SOLN
INTRAMUSCULAR | Status: DC | PRN
  Administered 2013-10-28: 6 mL

## 2013-10-28 MED ORDER — TRAMADOL HCL 50 MG PO TABS
50.0000 mg | ORAL_TABLET | Freq: Four times a day (QID) | ORAL | Status: DC | PRN
  Administered 2013-10-31: 50 mg via ORAL
  Filled 2013-10-28: qty 1

## 2013-10-28 MED ORDER — NEOSTIGMINE METHYLSULFATE 10 MG/10ML IV SOLN
INTRAVENOUS | Status: DC | PRN
  Administered 2013-10-28: 3 mg via INTRAVENOUS

## 2013-10-28 MED ORDER — GLYCOPYRROLATE 0.2 MG/ML IJ SOLN
INTRAMUSCULAR | Status: AC
  Filled 2013-10-28: qty 1

## 2013-10-28 MED ORDER — HYDROMORPHONE HCL PF 1 MG/ML IJ SOLN
INTRAMUSCULAR | Status: AC
  Filled 2013-10-28: qty 2

## 2013-10-28 MED ORDER — FENTANYL CITRATE 0.05 MG/ML IJ SOLN
INTRAMUSCULAR | Status: AC
  Filled 2013-10-28: qty 5

## 2013-10-28 MED ORDER — ZOLPIDEM TARTRATE 5 MG PO TABS
10.0000 mg | ORAL_TABLET | Freq: Every evening | ORAL | Status: DC | PRN
  Administered 2013-10-28 – 2013-10-31 (×2): 10 mg via ORAL
  Filled 2013-10-28 (×2): qty 2

## 2013-10-28 MED ORDER — 0.9 % SODIUM CHLORIDE (POUR BTL) OPTIME
TOPICAL | Status: DC | PRN
  Administered 2013-10-28: 1000 mL

## 2013-10-28 MED ORDER — ACETAMINOPHEN 325 MG PO TABS: 325.0000 mg | ORAL_TABLET | ORAL | Status: DC | PRN

## 2013-10-28 MED ORDER — KETOROLAC TROMETHAMINE 30 MG/ML IJ SOLN
30.0000 mg | Freq: Four times a day (QID) | INTRAMUSCULAR | Status: AC
  Administered 2013-10-28 – 2013-10-29 (×4): 30 mg via INTRAVENOUS
  Filled 2013-10-28 (×6): qty 1

## 2013-10-28 MED ORDER — LIDOCAINE HCL (CARDIAC) 20 MG/ML IV SOLN
INTRAVENOUS | Status: AC
  Filled 2013-10-28: qty 5

## 2013-10-28 MED ORDER — PROPOFOL 10 MG/ML IV BOLUS
INTRAVENOUS | Status: AC
  Filled 2013-10-28: qty 20

## 2013-10-28 MED ORDER — POTASSIUM CHLORIDE 10 MEQ/50ML IV SOLN: 10.0000 meq | Freq: Every day | INTRAVENOUS | Status: DC | PRN

## 2013-10-28 MED ORDER — PIPERACILLIN-TAZOBACTAM 3.375 G IVPB
3.3750 g | Freq: Four times a day (QID) | INTRAVENOUS | Status: DC
  Administered 2013-10-28: 3.375 g via INTRAVENOUS
  Filled 2013-10-28 (×3): qty 50

## 2013-10-28 MED ORDER — KETOROLAC TROMETHAMINE 30 MG/ML IJ SOLN
INTRAMUSCULAR | Status: AC
  Filled 2013-10-28: qty 1

## 2013-10-28 MED ORDER — ONDANSETRON HCL 4 MG/2ML IJ SOLN
4.0000 mg | Freq: Once | INTRAMUSCULAR | Status: AC | PRN
  Administered 2013-10-28: 4 mg via INTRAVENOUS

## 2013-10-28 MED ORDER — STERILE WATER FOR INJECTION IJ SOLN
INTRAMUSCULAR | Status: AC
  Filled 2013-10-28: qty 10

## 2013-10-28 MED ORDER — BISACODYL 5 MG PO TBEC
10.0000 mg | DELAYED_RELEASE_TABLET | Freq: Every day | ORAL | Status: DC
  Administered 2013-10-30 – 2013-11-01 (×3): 10 mg via ORAL
  Filled 2013-10-28 (×4): qty 2

## 2013-10-28 MED ORDER — ROCURONIUM BROMIDE 100 MG/10ML IV SOLN
INTRAVENOUS | Status: DC | PRN
  Administered 2013-10-28 (×3): 10 mg via INTRAVENOUS
  Administered 2013-10-28: 50 mg via INTRAVENOUS
  Administered 2013-10-28: 10 mg via INTRAVENOUS

## 2013-10-28 MED ORDER — OXYCODONE HCL 5 MG PO TABS: 5.0000 mg | ORAL_TABLET | ORAL | Status: DC | PRN

## 2013-10-28 MED ORDER — ARTIFICIAL TEARS OP OINT
TOPICAL_OINTMENT | OPHTHALMIC | Status: DC | PRN
  Administered 2013-10-28: 1 via OPHTHALMIC

## 2013-10-28 MED ORDER — EPHEDRINE SULFATE 50 MG/ML IJ SOLN
INTRAMUSCULAR | Status: AC
  Filled 2013-10-28: qty 1

## 2013-10-28 MED ORDER — PROPOFOL 10 MG/ML IV BOLUS
INTRAVENOUS | Status: DC | PRN
  Administered 2013-10-28: 200 mg via INTRAVENOUS

## 2013-10-28 MED ORDER — GLYCOPYRROLATE 0.2 MG/ML IJ SOLN
INTRAMUSCULAR | Status: DC | PRN
  Administered 2013-10-28: 0.2 mg via INTRAVENOUS
  Administered 2013-10-28: 0.4 mg via INTRAVENOUS

## 2013-10-28 MED ORDER — MIDAZOLAM HCL 2 MG/2ML IJ SOLN
INTRAMUSCULAR | Status: AC
  Filled 2013-10-28: qty 2

## 2013-10-28 MED ORDER — HYDROMORPHONE HCL PF 1 MG/ML IJ SOLN
0.2000 mg | INTRAMUSCULAR | Status: DC | PRN
  Administered 2013-10-29 – 2013-10-30 (×6): 0.5 mg via INTRAVENOUS
  Filled 2013-10-28 (×7): qty 1

## 2013-10-28 MED ORDER — FENTANYL CITRATE 0.05 MG/ML IJ SOLN
INTRAMUSCULAR | Status: DC | PRN
  Administered 2013-10-28: 100 ug via INTRAVENOUS
  Administered 2013-10-28 (×5): 50 ug via INTRAVENOUS

## 2013-10-28 MED ORDER — ROPIVACAINE HCL 2 MG/ML IJ SOLN
12.0000 mL/h | INTRAMUSCULAR | Status: DC
  Administered 2013-10-28 – 2013-10-30 (×4): 12 mL/h via EPIDURAL
  Filled 2013-10-28 (×10): qty 200

## 2013-10-28 MED ORDER — FENTANYL CITRATE 0.05 MG/ML IJ SOLN
50.0000 ug | INTRAMUSCULAR | Status: DC | PRN
  Administered 2013-10-29 – 2013-10-30 (×3): 50 ug via INTRAVENOUS
  Filled 2013-10-28 (×3): qty 2

## 2013-10-28 MED ORDER — VANCOMYCIN HCL IN DEXTROSE 1-5 GM/200ML-% IV SOLN
1000.0000 mg | Freq: Three times a day (TID) | INTRAVENOUS | Status: AC
  Administered 2013-10-28 – 2013-10-31 (×10): 1000 mg via INTRAVENOUS
  Filled 2013-10-28 (×12): qty 200

## 2013-10-28 MED ORDER — ACETAMINOPHEN 500 MG PO TABS
1000.0000 mg | ORAL_TABLET | Freq: Four times a day (QID) | ORAL | Status: DC
  Administered 2013-10-28 – 2013-11-01 (×9): 1000 mg via ORAL
  Filled 2013-10-28 (×18): qty 2

## 2013-10-28 MED ORDER — PROMETHAZINE HCL 25 MG/ML IJ SOLN
12.5000 mg | Freq: Once | INTRAMUSCULAR | Status: AC
  Administered 2013-10-28: 12.5 mg via INTRAVENOUS

## 2013-10-28 MED ORDER — ARTIFICIAL TEARS OP OINT
TOPICAL_OINTMENT | OPHTHALMIC | Status: AC
  Filled 2013-10-28: qty 3.5

## 2013-10-28 MED ORDER — ACETAMINOPHEN 160 MG/5ML PO SOLN
1000.0000 mg | Freq: Four times a day (QID) | ORAL | Status: DC
  Filled 2013-10-28 (×19): qty 40

## 2013-10-28 MED ORDER — SENNOSIDES-DOCUSATE SODIUM 8.6-50 MG PO TABS
1.0000 | ORAL_TABLET | Freq: Every day | ORAL | Status: DC
  Administered 2013-10-29 – 2013-10-31 (×3): 1 via ORAL
  Filled 2013-10-28 (×5): qty 1

## 2013-10-28 SURGICAL SUPPLY — 74 items
APPLICATOR TIP EXT COSEAL (VASCULAR PRODUCTS) ×4 IMPLANT
BAG DECANTER FOR FLEXI CONT (MISCELLANEOUS) IMPLANT
BLADE SURG 11 STRL SS (BLADE) ×4 IMPLANT
CANISTER SUCTION 2500CC (MISCELLANEOUS) ×4 IMPLANT
CATH KIT ON Q 5IN SLV (PAIN MANAGEMENT) IMPLANT
CATH ROBINSON RED A/P 22FR (CATHETERS) IMPLANT
CATH THORACIC 28FR (CATHETERS) IMPLANT
CATH THORACIC 36FR (CATHETERS) ×4 IMPLANT
CATH THORACIC 36FR RT ANG (CATHETERS) ×4 IMPLANT
CONT SPEC 4OZ CLIKSEAL STRL BL (MISCELLANEOUS) ×28 IMPLANT
COVER SURGICAL LIGHT HANDLE (MISCELLANEOUS) ×4 IMPLANT
DERMABOND ADVANCED (GAUZE/BANDAGES/DRESSINGS)
DERMABOND ADVANCED .7 DNX12 (GAUZE/BANDAGES/DRESSINGS) IMPLANT
DRAPE LAPAROSCOPIC ABDOMINAL (DRAPES) ×4 IMPLANT
DRAPE WARM FLUID 44X44 (DRAPE) ×4 IMPLANT
ELECT BLADE 4.0 EZ CLEAN MEGAD (MISCELLANEOUS) ×8
ELECT REM PT RETURN 9FT ADLT (ELECTROSURGICAL) ×4
ELECTRODE BLDE 4.0 EZ CLN MEGD (MISCELLANEOUS) ×4 IMPLANT
ELECTRODE REM PT RTRN 9FT ADLT (ELECTROSURGICAL) ×2 IMPLANT
GLOVE BIO SURGEON STRL SZ 6 (GLOVE) ×8 IMPLANT
GLOVE BIO SURGEON STRL SZ 6.5 (GLOVE) ×9 IMPLANT
GLOVE BIO SURGEONS STRL SZ 6.5 (GLOVE) ×3
GLOVE BIOGEL PI IND STRL 6 (GLOVE) ×4 IMPLANT
GLOVE BIOGEL PI INDICATOR 6 (GLOVE) ×4
GLOVE SURG SIGNA 7.5 PF LTX (GLOVE) ×8 IMPLANT
GOWN STRL REUS W/ TWL LRG LVL3 (GOWN DISPOSABLE) ×10 IMPLANT
GOWN STRL REUS W/TWL LRG LVL3 (GOWN DISPOSABLE) ×10
HANDLE STAPLE ENDO GIA SHORT (STAPLE) ×2
KIT BASIN OR (CUSTOM PROCEDURE TRAY) ×4 IMPLANT
KIT ROOM TURNOVER OR (KITS) ×4 IMPLANT
KIT SUCTION CATH 14FR (SUCTIONS) ×4 IMPLANT
NS IRRIG 1000ML POUR BTL (IV SOLUTION) ×8 IMPLANT
PACK CHEST (CUSTOM PROCEDURE TRAY) ×4 IMPLANT
PAD ARMBOARD 7.5X6 YLW CONV (MISCELLANEOUS) ×8 IMPLANT
RELOAD EGIA 45 MED/THCK PURPLE (STAPLE) ×4 IMPLANT
RELOAD EGIA 60 MED/THCK PURPLE (STAPLE) ×4 IMPLANT
RELOAD EGIA TRIS TAN 45 CVD (STAPLE) ×4 IMPLANT
RELOAD ENDO GIA 30 3.5 (STAPLE) ×4 IMPLANT
SEALANT SURG COSEAL 4ML (VASCULAR PRODUCTS) ×8 IMPLANT
SOLUTION ANTI FOG 6CC (MISCELLANEOUS) ×4 IMPLANT
SPONGE GAUZE 4X4 12PLY (GAUZE/BANDAGES/DRESSINGS) ×4 IMPLANT
SPONGE GAUZE 4X4 12PLY STER LF (GAUZE/BANDAGES/DRESSINGS) ×4 IMPLANT
SPONGE TONSIL 1.25 RF SGL STRG (GAUZE/BANDAGES/DRESSINGS) ×8 IMPLANT
STAPLER ENDO GIA 12MM SHORT (STAPLE) ×2 IMPLANT
SUT CHROMIC 3 0 SH 27 (SUTURE) ×8 IMPLANT
SUT ETHILON 3 0 PS 1 (SUTURE) IMPLANT
SUT PROLENE 3 0 SH DA (SUTURE) IMPLANT
SUT PROLENE 4 0 RB 1 (SUTURE)
SUT PROLENE 4-0 RB1 .5 CRCL 36 (SUTURE) IMPLANT
SUT PROLENE 6 0 C 1 30 (SUTURE) IMPLANT
SUT SILK  1 MH (SUTURE) ×6
SUT SILK 1 MH (SUTURE) ×6 IMPLANT
SUT SILK 2 0SH CR/8 30 (SUTURE) IMPLANT
SUT SILK 3 0SH CR/8 30 (SUTURE) IMPLANT
SUT VIC AB 1 CTX 18 (SUTURE) ×8 IMPLANT
SUT VIC AB 2 TP1 27 (SUTURE) ×4 IMPLANT
SUT VIC AB 2-0 CT1 27 (SUTURE) ×2
SUT VIC AB 2-0 CT1 TAPERPNT 27 (SUTURE) ×2 IMPLANT
SUT VIC AB 2-0 CT2 18 VCP726D (SUTURE) IMPLANT
SUT VIC AB 2-0 CTX 36 (SUTURE) ×4 IMPLANT
SUT VIC AB 3-0 SH 18 (SUTURE) IMPLANT
SUT VIC AB 3-0 X1 27 (SUTURE) ×4 IMPLANT
SUT VICRYL 0 UR6 27IN ABS (SUTURE) IMPLANT
SUT VICRYL 2 TP 1 (SUTURE) ×4 IMPLANT
SWAB COLLECTION DEVICE MRSA (MISCELLANEOUS) IMPLANT
SYSTEM SAHARA CHEST DRAIN ATS (WOUND CARE) ×4 IMPLANT
TAPE CLOTH SURG 4X10 WHT LF (GAUZE/BANDAGES/DRESSINGS) ×4 IMPLANT
TIP APPLICATOR SPRAY EXTEND 16 (VASCULAR PRODUCTS) IMPLANT
TOWEL OR 17X24 6PK STRL BLUE (TOWEL DISPOSABLE) ×4 IMPLANT
TOWEL OR 17X26 10 PK STRL BLUE (TOWEL DISPOSABLE) ×8 IMPLANT
TRAP SPECIMEN MUCOUS 40CC (MISCELLANEOUS) ×8 IMPLANT
TRAY FOLEY CATH 16FRSI W/METER (SET/KITS/TRAYS/PACK) ×4 IMPLANT
TUBE ANAEROBIC SPECIMEN COL (MISCELLANEOUS) IMPLANT
WATER STERILE IRR 1000ML POUR (IV SOLUTION) ×8 IMPLANT

## 2013-10-28 NOTE — Transfer of Care (Signed)
Immediate Anesthesia Transfer of Care Note  Patient: [name]  Procedure(s) Performed: Procedure(s) with comments: VIDEO ASSISTED THORACOSCOPY (VATS)/DECORTICATION (Right) - EPIDURAL ANESTHESIA PER DR VANTRIGT VIDEO BRONCHOSCOPY (N/A) WEDGE RESECTION RIGHT LOWER LOBE (Right)  Patient Location: PACU  Anesthesia Type:General  Level of Consciousness: awake and patient cooperative  Airway & Oxygen Therapy: Patient Spontanous Breathing and Patient connected to face mask oxygen  Post-op Assessment: Report given to PACU RN, Post -op Vital signs reviewed and stable and Patient moving all extremities X 4  Post vital signs: Reviewed and stable  Complications: No apparent anesthesia complications

## 2013-10-28 NOTE — Brief Op Note (Signed)
10/28/2013  10:39 AM  PATIENT:  [name]  38 y.o. male  PRE-OPERATIVE DIAGNOSIS:  (R) PLEURAL EFFUSION  POST-OPERATIVE DIAGNOSIS:  (R) PLEURAL EFFUSION, EMPYEMA  PROCEDURE:  VIDEO BRONCHOSCOPY, RIGHT VIDEO ASSISTED THORACOSCOPY (VATS), RIGHT DECORTICATION, WEDGE RLL  WEDGE RESECTION RIGHT LOWER LOBE   SURGEON:  Surgeon(s) and Role:    * Kerin PernaPeter Van Trigt, MD - Primary  PHYSICIAN ASSISTANT: Doree Fudgeonielle Aynsley Fleet PA-C  ANESTHESIA:   epidural  EBL:  Total I/O In: -  Out: 450 [Urine:350; Blood:100]  BLOOD ADMINISTERED:none  DRAINS: Two 36 French chest tubes in the right pleural space    SPECIMEN:  Source of Specimen:  Wedge RLL, pleural peel, pleural biopsies  DISPOSITION OF SPECIMEN:  Pathology, culture  COUNTS CORRECT:  YES  DICTATION: .Dragon Dictation  PLAN OF CARE: Admit to inpatient   PATIENT DISPOSITION:  PACU - hemodynamically stable.   Delay start of Pharmacological VTE agent (>24hrs) due to surgical blood loss or risk of bleeding: yes

## 2013-10-28 NOTE — Anesthesia Postprocedure Evaluation (Signed)
  Anesthesia Post-op Note  Patient: [name]  Procedure(s) Performed: Procedure(s) with comments: VIDEO ASSISTED THORACOSCOPY (VATS)/DECORTICATION (Right) - EPIDURAL ANESTHESIA PER DR VANTRIGT VIDEO BRONCHOSCOPY (N/A) WEDGE RESECTION RIGHT LOWER LOBE (Right)  Patient Location: PACU  Anesthesia Type:General and Epidural  Level of Consciousness: awake and alert   Airway and Oxygen Therapy: Patient Spontanous Breathing and Patient connected to nasal cannula oxygen  Post-op Pain: mild  Post-op Assessment: Post-op Vital signs reviewed, Patient's Cardiovascular Status Stable, Respiratory Function Stable, Patent Airway, No signs of Nausea or vomiting and Pain level controlled  Post-op Vital Signs: Reviewed and stable  Last Vitals:  Filed Vitals:   10/28/13 1700  BP:   Pulse: 54  Temp:   Resp: 16    Complications: No apparent anesthesia complications

## 2013-10-28 NOTE — Anesthesia Preprocedure Evaluation (Signed)
Anesthesia Evaluation  Patient identified by MRN, date of birth, ID band Patient awake    Reviewed: Allergy & Precautions, H&P , NPO status , Patient's Chart, lab work & pertinent test results  History of Anesthesia Complications Negative for: history of anesthetic complications  Airway Mallampati: I TM Distance: >3 FB Neck ROM: Full    Dental  (+) Teeth Intact   Pulmonary shortness of breath and with exertion,  Recurrent pleural effusion   + decreased breath sounds      Cardiovascular negative cardio ROS  Rhythm:Regular     Neuro/Psych negative neurological ROS     GI/Hepatic negative GI ROS, Neg liver ROS,   Endo/Other  negative endocrine ROS  Renal/GU negative Renal ROS     Musculoskeletal   Abdominal   Peds  Hematology negative hematology ROS (+)   Anesthesia Other Findings   Reproductive/Obstetrics                           Anesthesia Physical Anesthesia Plan  ASA: II  Anesthesia Plan: General and Epidural   Post-op Pain Management:    Induction: Intravenous  Airway Management Planned: Double Lumen EBT  Additional Equipment: Arterial line  Intra-op Plan:   Post-operative Plan: Extubation in OR  Informed Consent: I have reviewed the patients History and Physical, chart, labs and discussed the procedure including the risks, benefits and alternatives for the proposed anesthesia with the patient or authorized representative who has indicated his/her understanding and acceptance.   Dental advisory given  Plan Discussed with: CRNA and Surgeon  Anesthesia Plan Comments:         Anesthesia Quick Evaluation

## 2013-10-28 NOTE — Addendum Note (Signed)
Addendum created 10/28/13 1937 by Corky Soxhris Keondria Siever, MD   Modules edited: Orders

## 2013-10-28 NOTE — Progress Notes (Signed)
ANTIBIOTIC CONSULT NOTE - INITIAL  Pharmacy Consult for vancomycin Indication: s/p R VATS for early empyema  No Known Allergies  Patient Measurements: Height: 6\' 2"  (188 cm) Weight: 208 lb (94.348 kg) IBW/kg (Calculated) : 82.2  Vital Signs: Temp: 98.7 F (37.1 C) (06/12 1530) Temp src: Oral (06/12 1530) BP: 97/56 mmHg (06/12 1607) Pulse Rate: 48 (06/12 1821) Intake/Output from previous day:   Intake/Output from this shift:    Labs:  Recent Labs  10/27/13 1303  WBC 8.4  HGB 13.9  PLT 338  CREATININE 0.92   Estimated Creatinine Clearance: 126.6 ml/min (by C-G formula based on Cr of 0.92). No results found for this basename: VANCOTROUGH, Leodis BinetVANCOPEAK, VANCORANDOM, GENTTROUGH, GENTPEAK, GENTRANDOM, TOBRATROUGH, TOBRAPEAK, TOBRARND, AMIKACINPEAK, AMIKACINTROU, AMIKACIN,  in the last 72 hours   Microbiology: Recent Results (from the past 720 hour(s))  BODY FLUID CULTURE     Status: None   Collection Time    10/26/13  4:04 PM      Result Value Ref Range Status   Specimen Description PLEURAL   Final   Special Requests NONE   Final   Gram Stain     Final   Value: MODERATE WBC PRESENT,BOTH PMN AND MONONUCLEAR     NO ORGANISMS SEEN     Performed at [facility]   Culture     Final   Value: NO GROWTH 1 DAY     Performed at [facility]   Report Status PENDING   Incomplete  SURGICAL PCR SCREEN     Status: None   Collection Time    10/27/13  1:03 PM      Result Value Ref Range Status   MRSA, PCR NEGATIVE  NEGATIVE Final   Staphylococcus aureus NEGATIVE  NEGATIVE Final   Comment:            The Xpert SA Assay (FDA     approved for NASAL specimens     in patients over 38 years of age),     is one component of     a comprehensive surveillance     program.  Test performance has     been validated by [facility] for patients greater     than or equal to 38 year old.     It is not intended     to diagnose infection nor to     guide or monitor  treatment.    Medical History: Past Medical History  Diagnosis Date  . Pleural effusion     Medications:  Prescriptions prior to admission  Medication Sig Dispense Refill  . cetirizine (ZYRTEC) 10 MG tablet Take 10 mg by mouth daily as needed for allergies.       Marland Kitchen. ibuprofen (ADVIL,MOTRIN) 200 MG tablet Take 200-800 mg by mouth every 6 (six) hours as needed (for pain).      Marland Kitchen. levofloxacin (LEVAQUIN) 500 MG tablet Take 1 tablet (500 mg total) by mouth daily.  7 tablet  0  . Ranitidine HCl (ZANTAC PO) Take 1 tablet by mouth daily as needed (for heart burn).       . zolpidem (AMBIEN) 10 MG tablet Take 10 mg by mouth at bedtime as needed for sleep.       Assessment: 38 y/o male with a R pleural effusion s/p thoracentesis on 6/10 who presented with recurrent symptoms. He is now s/p R VATS. Pharmacy consulted to begin vancomycin for early empyema. He is also on Zosyn. He  had Zinacef 1.5 g IV preop at 07:49 and received Zosyn 3.375 g IV at 13:19. Renal function is normal, WBC are normal, and cultures are pending.   Goal of Therapy:  Vancomycin trough level 15-20 mcg/ml  Plan:  - Vancomycin 1000 mg IV q8h - Monitor renal function, clinical progress, and culture data - Vancomycin trough at steady state   [facility] LtdJennifer Coleville, [address] BoulevardPharm.D., BCPS Clinical Pharmacist Pager: [phone] 10/28/2013 7:36 PM

## 2013-10-28 NOTE — Progress Notes (Signed)
The patient was examined and preop studies reviewed. There has been no change from the prior exam and the patient is ready for surgery.   Plan bronchoscopy and Right VATS, decortication of loculated effusion on B [name]

## 2013-10-29 ENCOUNTER — Inpatient Hospital Stay (HOSPITAL_COMMUNITY): Payer: PRIVATE HEALTH INSURANCE

## 2013-10-29 ENCOUNTER — Encounter (HOSPITAL_COMMUNITY): Payer: Self-pay | Admitting: *Deleted

## 2013-10-29 LAB — BLOOD GAS, ARTERIAL
Acid-Base Excess: 1.5 mmol/L (ref 0.0–2.0)
Bicarbonate: 25.2 mEq/L — ABNORMAL HIGH (ref 20.0–24.0)
Drawn by: 41308
FIO2: 0.21 %
O2 SAT: 98.9 %
PATIENT TEMPERATURE: 98.6
PO2 ART: 86.4 mmHg (ref 80.0–100.0)
TCO2: 26.3 mmol/L (ref 0–100)
pCO2 arterial: 37.4 mmHg (ref 35.0–45.0)
pH, Arterial: 7.443 (ref 7.350–7.450)

## 2013-10-29 LAB — BASIC METABOLIC PANEL
BUN: 17 mg/dL (ref 6–23)
CALCIUM: 8.9 mg/dL (ref 8.4–10.5)
CO2: 25 mEq/L (ref 19–32)
CREATININE: 1.08 mg/dL (ref 0.50–1.35)
Chloride: 100 mEq/L (ref 96–112)
GFR, EST NON AFRICAN AMERICAN: 86 mL/min — AB (ref >90)
Glucose, Bld: 136 mg/dL — ABNORMAL HIGH (ref 70–99)
Potassium: 4.3 mEq/L (ref 3.7–5.3)
Sodium: 137 mEq/L (ref 137–147)

## 2013-10-29 LAB — CBC
HCT: 35.1 % — ABNORMAL LOW (ref 39.0–52.0)
Hemoglobin: 11.6 g/dL — ABNORMAL LOW (ref 13.0–17.0)
MCH: 28.4 pg (ref 26.0–34.0)
MCHC: 33 g/dL (ref 30.0–36.0)
MCV: 86 fL (ref 78.0–100.0)
Platelets: 335 10*3/uL (ref 150–400)
RBC: 4.08 MIL/uL — AB (ref 4.22–5.81)
RDW: 12 % (ref 11.5–15.5)
WBC: 11.3 10*3/uL — ABNORMAL HIGH (ref 4.0–10.5)

## 2013-10-29 MED ORDER — HEPARIN SODIUM (PORCINE) 5000 UNIT/ML IJ SOLN
5000.0000 [IU] | Freq: Two times a day (BID) | INTRAMUSCULAR | Status: DC
  Administered 2013-10-29 – 2013-10-31 (×6): 5000 [IU] via SUBCUTANEOUS
  Filled 2013-10-29 (×8): qty 1

## 2013-10-29 NOTE — Progress Notes (Signed)
Epidural Progress note  38 yo M POD #1 s/p right VATs. Epidural infusion running with adequate pain control overnight with intermittent narcotics. Describes 4/10 pain along right anterior axillary line adjacent to chest tubes.  Filed Vitals:   10/20/13 1204  BP: 124/71  Pulse: 72  Temp:   Resp: 20   Awake, Alert and Oriented Regular rate and rhythm Dressing clean, dry and intact. Dry heme at insertion site. No erythema or tenderness. Catheter at 15cm Normal motor upper and lower extremities  As patient to remain hospitalized for several more days and continues to have chest tubes in place we will continue epidural catheter for pain management. Will continue epidural Ropivacaine infusion at 12 ml/hr. Adjunct pain management with Tylenol, NSAIDs, and prn narcotics to achieve desired pain score. Consider prn epidural bolus to improve coverage and achieve adequate analgesia. Will continue to follow and plan for removal of catheter once chest tubes removed.   10/29/2013 4:35 PM Lanay Zinda, Cristal DeerHRISTOPHER, MD

## 2013-10-29 NOTE — Progress Notes (Signed)
1 Day Post-Op Procedure(s) (LRB): VIDEO ASSISTED THORACOSCOPY (VATS)/DECORTICATION (Right) VIDEO BRONCHOSCOPY (N/A) WEDGE RESECTION RIGHT LOWER LOBE (Right) Subjective: Postop day 1 right VATS for empyema Operative cultures-Gram stain negative for organisms, few white cells Afebrile No air leak from chest tubes minimal sero sanguinous drainage Objective: Vital signs in last 24 hours: Temp:  [98.1 F (36.7 C)-99 F (37.2 C)] 98.5 F (36.9 C) (06/13 1139) Pulse Rate:  [48-78] 57 (06/13 0748) Cardiac Rhythm:  [-] Normal sinus rhythm;Sinus bradycardia (06/13 0752) Resp:  [12-35] 20 (06/13 0748) BP: (97-134)/(42-61) 111/61 mmHg (06/13 0748) SpO2:  [91 %-98 %] 98 % (06/13 0748) Arterial Line BP: (88-139)/(43-58) 139/58 mmHg (06/13 0748)  Hemodynamic parameters for last 24 hours:   stable  Intake/Output from previous day: 06/12 0701 - 06/13 0700 In: 4450 [P.O.:720; I.V.:3175; IV Piggyback:555] Out: 2945 [Urine:2625; Blood:100; Chest Tube:220] Intake/Output this shift: Total I/O In: 730 [P.O.:480; I.V.:250] Out: -   Walking around ICU Epidural anesthesia level adequate Lungs clear No extremity tenderness Lab Results:  Recent Labs  10/27/13 1303 10/29/13 0432  WBC 8.4 11.3*  HGB 13.9 11.6*  HCT 41.4 35.1*  PLT 338 335   BMET:  Recent Labs  10/27/13 1303 10/29/13 0432  NA 138 137  K 4.3 4.3  CL 102 100  CO2 17* 25  GLUCOSE 96 136*  BUN 17 17  CREATININE 0.92 1.08  CALCIUM 9.4 8.9    PT/INR:  Recent Labs  10/27/13 1303  LABPROT 14.9  INR 1.20   ABG    Component Value Date/Time   PHART 7.443 10/29/2013 0420   HCO3 25.2* 10/29/2013 0420   TCO2 26.3 10/29/2013 0420   ACIDBASEDEF 1.1 10/27/2013 1303   O2SAT 98.9 10/29/2013 0420   CBG (last 3)   Recent Labs  10/28/13 1117  GLUCAP 112*    Assessment/Plan: S/P Procedure(s) (LRB): VIDEO ASSISTED THORACOSCOPY (VATS)/DECORTICATION (Right) VIDEO BRONCHOSCOPY (N/A) WEDGE RESECTION RIGHT LOWER LOBE  (Right) DC A-line, Foley, and reduced IV fluid rate Leave both chest tubes to drainage Continue IV antibiotics Start prophylactic regular heparin 5000 units every 12h   LOS: 1 day    VAN TRIGT III,Abeer Iversen 10/29/2013

## 2013-10-29 NOTE — Op Note (Signed)
[name]:  [name], [name]                ACCOUNT NO.:  [id number]  MEDICAL RECORD NO.:  [id number]  LOCATION:  3S08C                        FACILITY:  [facility]  PHYSICIAN:  Kerin PernaPeter Van Trigt, M.D.  DATE OF BIRTH:  [birth year]  DATE OF PROCEDURE:  10/28/2013 DATE OF DISCHARGE:                              OPERATIVE REPORT   OPERATION: 1. Video bronchoscopy. 2. Right VATS (video-assisted thoracoscopic surgery with decortication     of right lower lobe, wedge resection of posterior segment right     lower lobe.  SURGEON:  Kerin PernaPeter Van Trigt, M.D.  ASSISTANT:  Doree Fudgeonielle Zimmerman, PA-C  PREOPERATIVE DIAGNOSIS:  Loculated right pleural effusion-empyema, right lower lobe.  POSTOPERATIVE DIAGNOSIS:  Loculated right pleural effusion-empyema, right lower lobe.  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  This was very symptomatic.  [facility]. The fluid was negative for cytology and negative for culture.  He initially felt better, but then developed significant symptoms.  An attempt at ultrasound-guided thoracentesis was negative because of multiple loculated pockets of fluid.  He remained symptomatic and a VATS decortication was recommended by Pulmonary Medicine.  I examined the patient in the office and agreed with the plan for bronchoscopy and right VATS decortication for a probable entrapped lower lobe.  The patient denied history of trauma or aspiration or preceding episodes of pneumonia.  I discussed the procedure with the patient in detail including the use of general anesthesia, the location of the surgical incisions, and the expected postoperative hospital care with chest tube management as well. He understood the risks of  bleeding, recurrent infection, prolonged air leak, recurrent pleural effusion, and agreed to proceed with surgery under informed consent.  OPERATIVE PROCEDURE:  The patient was brought to the operating room and placed supine on the operating table after an epidural catheter had been placed in the preop area.  General anesthesia was induced.  Through that, a proper time-out was performed.  Through the endotracheal tube, a fiberoptic bronchoscope was passed. The distal trachea and carina were normal.  The mainstem bronchus of the right lung was normal as were the endobronchial segments of the right upper lobe, right lower lobe and right middle lobe.  Washings of the right lower lobe were taken for culture.  The bronchoscope was then passed on the left mainstem bronchus which was normal.  The endobronchial site once of the left upper lobe and left lower lobe were all visualized and were normal and completely clear.  The bronchoscope was withdrawn.  The patient was then intubated with a double-lumen tube, turned right side up and prepped and draped as a sterile field.  A proper time-out was repeated.  A small incision was made from the tip of the scapula and a VATS camera was inserted.  The pleural space was obliterated with thick adhesions with minimal visibility.  The incision was extended approximately 2 inches.  The ribs were gently spread.  Using the scope as well as  open dissection, the lower lobe was eventually mobilized.  The pockets of pleural fluid were opened and drained and there was a large amount of solid peel on the visceral pleura of the right lower lobe as well as the chest wall and diaphragm.  This was all cleaned out and the visceral pleura peel was removed to free up the lower lobe.  The posterior basilar segment of the right lower lobe was especially indurated and inflamed and this was wedged using the Endo-GIA stapler for cultures and pathologic studies.  The  pleural space was irrigated with warm saline.  Some medical adhesive- Coseal was placed over this staple line, where the lung was biopsied. The pleural space was drained with 2 chest tubes, which were brought out through separate incisions.  The lungs re-expanded under direct vision. The lungs filled the space well.  The incision was then closed using interrupted #2 Vicryl pericostals.  The muscle was closed in layers using #1 Vicryl, and the subcutaneous and skin layers were closed in running Vicryl.  Sterile dressings were applied and the chest tubes were connected to an underwater seal Pleur-Evac drainage system.  Chest x-ray in the operating room before the patient left showed evacuation of the loculated effusion with no pneumothorax.     Kerin PernaPeter Van Trigt, M.D.     PV/MEDQ  D:  10/28/2013  T:  10/29/2013  Job:  [id number]

## 2013-10-30 ENCOUNTER — Inpatient Hospital Stay (HOSPITAL_COMMUNITY): Payer: PRIVATE HEALTH INSURANCE

## 2013-10-30 LAB — BODY FLUID CULTURE: Culture: NO GROWTH

## 2013-10-30 LAB — CBC
HCT: 41 % (ref 39.0–52.0)
Hemoglobin: 13.5 g/dL (ref 13.0–17.0)
MCH: 28.8 pg (ref 26.0–34.0)
MCHC: 32.9 g/dL (ref 30.0–36.0)
MCV: 87.6 fL (ref 78.0–100.0)
PLATELETS: 416 10*3/uL — AB (ref 150–400)
RBC: 4.68 MIL/uL (ref 4.22–5.81)
RDW: 12.1 % (ref 11.5–15.5)
WBC: 8.5 10*3/uL (ref 4.0–10.5)

## 2013-10-30 LAB — CULTURE, RESPIRATORY W GRAM STAIN
Culture: NO GROWTH
Gram Stain: NONE SEEN

## 2013-10-30 LAB — COMPREHENSIVE METABOLIC PANEL
ALBUMIN: 2.7 g/dL — AB (ref 3.5–5.2)
ALT: 18 U/L (ref 0–53)
AST: 27 U/L (ref 0–37)
Alkaline Phosphatase: 79 U/L (ref 39–117)
BILIRUBIN TOTAL: 0.3 mg/dL (ref 0.3–1.2)
BUN: 11 mg/dL (ref 6–23)
CHLORIDE: 101 meq/L (ref 96–112)
CO2: 29 meq/L (ref 19–32)
Calcium: 9.3 mg/dL (ref 8.4–10.5)
Creatinine, Ser: 1.12 mg/dL (ref 0.50–1.35)
GFR calc Af Amer: >90 mL/min (ref >90)
GFR, EST NON AFRICAN AMERICAN: 82 mL/min — AB (ref >90)
Glucose, Bld: 112 mg/dL — ABNORMAL HIGH (ref 70–99)
POTASSIUM: 4.2 meq/L (ref 3.7–5.3)
SODIUM: 143 meq/L (ref 137–147)
Total Protein: 7.5 g/dL (ref 6.0–8.3)

## 2013-10-30 MED ORDER — NALOXONE HCL 0.4 MG/ML IJ SOLN: 0.4000 mg | INTRAMUSCULAR | Status: DC | PRN

## 2013-10-30 MED ORDER — DIPHENHYDRAMINE HCL 50 MG/ML IJ SOLN
12.5000 mg | Freq: Four times a day (QID) | INTRAMUSCULAR | Status: DC | PRN
Start: 2013-10-30 — End: 2013-10-31

## 2013-10-30 MED ORDER — KETOROLAC TROMETHAMINE 15 MG/ML IJ SOLN
15.0000 mg | Freq: Once | INTRAMUSCULAR | Status: AC
  Administered 2013-10-30: 15 mg via INTRAVENOUS

## 2013-10-30 MED ORDER — HYDROMORPHONE 0.3 MG/ML IV SOLN
INTRAVENOUS | Status: DC
  Administered 2013-10-30: 0.2 mg via INTRAVENOUS
  Administered 2013-10-30: 0.599 mg via INTRAVENOUS
  Administered 2013-10-30: 11:00:00 via INTRAVENOUS
  Administered 2013-10-30: 0.799 mg via INTRAVENOUS
  Administered 2013-10-31: 1.39 mg via INTRAVENOUS
  Administered 2013-10-31: 0.4 mg via INTRAVENOUS
  Administered 2013-10-31: 0.599 mg via INTRAVENOUS
  Administered 2013-10-31: 0.399 mg via INTRAVENOUS
  Administered 2013-10-31: 0.2 mg via INTRAVENOUS

## 2013-10-30 MED ORDER — ONDANSETRON HCL 4 MG/2ML IJ SOLN
4.0000 mg | Freq: Four times a day (QID) | INTRAMUSCULAR | Status: DC | PRN
  Administered 2013-10-31 – 2013-11-01 (×3): 4 mg via INTRAVENOUS
  Filled 2013-10-30 (×2): qty 2

## 2013-10-30 MED ORDER — DIPHENHYDRAMINE HCL 12.5 MG/5ML PO ELIX
12.5000 mg | ORAL_SOLUTION | Freq: Four times a day (QID) | ORAL | Status: DC | PRN
  Filled 2013-10-30: qty 5

## 2013-10-30 MED ORDER — KETOROLAC TROMETHAMINE 15 MG/ML IJ SOLN
INTRAMUSCULAR | Status: AC
  Administered 2013-10-30: 15 mg via INTRAVENOUS
  Filled 2013-10-30: qty 1

## 2013-10-30 MED ORDER — HYDROMORPHONE 0.3 MG/ML IV SOLN
INTRAVENOUS | Status: AC
  Administered 2013-10-30: 0.2 mg
  Filled 2013-10-30: qty 25

## 2013-10-30 MED ORDER — SODIUM CHLORIDE 0.9 % IJ SOLN: 9.0000 mL | INTRAMUSCULAR | Status: DC | PRN

## 2013-10-30 NOTE — Progress Notes (Signed)
Utilization Review Completed.[name] T6/14/2015  

## 2013-10-30 NOTE — Progress Notes (Addendum)
      [address]       [place] [id number]             [phone]       2 Days Post-Op Procedure(s) (LRB): VIDEO ASSISTED THORACOSCOPY (VATS)/DECORTICATION (Right) VIDEO BRONCHOSCOPY (N/A) WEDGE RESECTION RIGHT LOWER LOBE (Right)  Subjective: He walked in the hallway earlier. Is still having pain control issues.  Objective: Vital signs in last 24 hours: Temp:  [98.1 F (36.7 C)-98.6 F (37 C)] 98.2 F (36.8 C) (06/14 0751) Pulse Rate:  [52-72] 59 (06/14 0751) Cardiac Rhythm:  [-] Sinus bradycardia (06/14 0751) Resp:  [18-26] 19 (06/14 0751) BP: (96-117)/(48-62) 106/62 mmHg (06/14 0751) SpO2:  [95 %-99 %] 97 % (06/14 0751)      Intake/Output from previous day: 06/13 0701 - 06/14 0700 In: 2559.1 [P.O.:960; I.V.:849.1; IV Piggyback:750] Out: 1421 [Urine:1301; Chest Tube:120]   Physical Exam:  Cardiovascular: RRR, no murmurs, gallops, or rubs. Pulmonary: Clear to auscultation bilaterally; no rales, wheezes, or rhonchi. Abdomen: Soft, non tender, bowel sounds present. Extremities: Trace bilateral lower extremity edema. Wounds: Clean and dry.  No erythema or signs of infection. Chest Tubes: to suction and no air leak  Lab Results: CBC: Recent Labs  10/29/13 0432 10/30/13 0221  WBC 11.3* 8.5  HGB 11.6* 13.5  HCT 35.1* 41.0  PLT 335 416*   BMET:  Recent Labs  10/29/13 0432 10/30/13 0221  NA 137 143  K 4.3 4.2  CL 100 101  CO2 25 29  GLUCOSE 136* 112*  BUN 17 11  CREATININE 1.08 1.12  CALCIUM 8.9 9.3    PT/INR:  Recent Labs  10/27/13 1303  LABPROT 14.9  INR 1.20   ABG:  INR: Will add last result for INR, ABG once components are confirmed Will add last 4 CBG results once components are confirmed  Assessment/Plan:  1. CV - SR 2.  Pulmonary - Chest tubes with 175 cc last 24 hours. Chest tubes are to suction. There is no air leak. As discussed with Dr. Tyrone SageGerhardt, will remove anterior chest tube today. CXR shows no  pneumothorax, bibasilar atelectasis, right pleural effusion, and stable cardiomegaly. No malignancy on path. No AFB and no growth yet on cultures. 3. Epidural per anesthesia 4.ID- Continue Zosyn and Vanco  5. Regarding pain control, using Fentanyl PRN but does not help alleviate pain. He is hesitant to use Oxy as makes him have nausea so will discontinue. Will give Dilaudid reduced dose PCA and one ose of Toradol 15 mg (as discussed with surgeon). Try Ultram PRN as well.  [name],[name] 10/30/2013,9:16 AM  Anterior chest tube removed, very little drainage Still pain issus with epidural , pca added Watch cr, patient notes it was up to 2.2 2 months ago I have seen and examined [name] and agree with the above assessment  and plan.  Delight OvensEdward B Zeffie Bickert [name] Beeper [phone] Office [phone] 10/30/2013 11:53 AM

## 2013-10-31 ENCOUNTER — Inpatient Hospital Stay (HOSPITAL_COMMUNITY): Payer: PRIVATE HEALTH INSURANCE

## 2013-10-31 ENCOUNTER — Encounter (HOSPITAL_COMMUNITY): Payer: Self-pay | Admitting: Cardiothoracic Surgery

## 2013-10-31 LAB — TISSUE CULTURE
Culture: NO GROWTH
Culture: NO GROWTH
Culture: NO GROWTH
Gram Stain: NONE SEEN
Gram Stain: NONE SEEN
Gram Stain: NONE SEEN

## 2013-10-31 LAB — VANCOMYCIN, TROUGH: Vancomycin Tr: 13.3 ug/mL (ref 10.0–20.0)

## 2013-10-31 MED ORDER — VANCOMYCIN HCL 10 G IV SOLR
1250.0000 mg | Freq: Three times a day (TID) | INTRAVENOUS | Status: DC
  Administered 2013-11-01: 1250 mg via INTRAVENOUS
  Filled 2013-10-31 (×4): qty 1250

## 2013-10-31 MED ORDER — HYDROMORPHONE HCL 2 MG PO TABS
1.0000 mg | ORAL_TABLET | ORAL | Status: DC | PRN
  Administered 2013-10-31 (×2): 2 mg via ORAL
  Filled 2013-10-31 (×2): qty 1

## 2013-10-31 NOTE — Addendum Note (Signed)
Addendum created 10/31/13 1555 by Hart Robinsonsharles Braun Rocca, MD   Modules edited: Anesthesia LDA

## 2013-10-31 NOTE — Progress Notes (Addendum)
      [address]       [place] [id number]             [phone]       3 Days Post-Op Procedure(s) (LRB): VIDEO ASSISTED THORACOSCOPY (VATS)/DECORTICATION (Right) VIDEO BRONCHOSCOPY (N/A) WEDGE RESECTION RIGHT LOWER LOBE (Right)  Subjective: Patient states Dilaudid PCA has helped with pain.  Objective: Vital signs in last 24 hours: Temp:  [97.7 F (36.5 C)-98.7 F (37.1 C)] 97.9 F (36.6 C) (06/15 0719) Pulse Rate:  [49-69] 51 (06/15 0719) Cardiac Rhythm:  [-] Sinus bradycardia (06/15 0719) Resp:  [13-28] 13 (06/15 0719) BP: (103-116)/(53-57) 105/56 mmHg (06/15 0719) SpO2:  [94 %-98 %] 96 % (06/15 0719)      Intake/Output from previous day: 06/14 0701 - 06/15 0700 In: 998.4 [I.V.:248.4; IV Piggyback:750] Out: 1490 [Urine:1450; Chest Tube:40]   Physical Exam:  Cardiovascular: RRR Pulmonary: Mostly clear to auscultation bilaterally; no rales, wheezes, or rhonchi. Abdomen: Soft, non tender, bowel sounds present. Extremities: Trace bilateral lower extremity edema. Wounds: Clean and dry.  No erythema or signs of infection. Chest Tube: to water seal and no air leak  Lab Results: CBC:  Recent Labs  10/29/13 0432 10/30/13 0221  WBC 11.3* 8.5  HGB 11.6* 13.5  HCT 35.1* 41.0  PLT 335 416*   BMET:   Recent Labs  10/29/13 0432 10/30/13 0221  NA 137 143  K 4.3 4.2  CL 100 101  CO2 25 29  GLUCOSE 136* 112*  BUN 17 11  CREATININE 1.08 1.12  CALCIUM 8.9 9.3    PT/INR: No results found for this basename: LABPROT, INR,  in the last 72 hours ABG:  INR: Will add last result for INR, ABG once components are confirmed Will add last 4 CBG results once components are confirmed  Assessment/Plan:  1. CV - SR 2.  Pulmonary - Chest tubes with 40 cc last 24 hours. Chest tube is to water seal. There is no air leak. . CXR shows no pneumothorax, bibasilar atelectasis, right pleural effusion, and stable cardiomegaly. No malignancy on path. No AFB  and no growth yet on cultures. 3. Epidural per anesthesia 4.ID- Continue Zosyn and Vanco  5. Regarding pain control. Continue Dilaudid reduced dose PCA. Ultram had not really helped. Will try Dilaudid po for oral pain control.  [name],Cody MPA-C 10/31/2013,8:19 AM   DC tube Will stop epidural infusion later today and keep cath in case he needs a bolus PAL CXR in am Home on oral Augmentin for a week  patient examined and medical record reviewed,agree with above note. VAN TRIGT III,[name] 10/31/2013

## 2013-10-31 NOTE — Discharge Summary (Signed)
Physician Discharge Summary       [address] [place].[address]       [name] [address]             [phone]    Patient ID: [name] MRN: [id number] DOB/AGE: 38-30-1977 38 y.o.  Admit date: 10/28/2013 Discharge date: 11/01/2013  Admission Diagnoses: 1. Right pleural effusion 2. Right empyema  Discharge Diagnoses:  1. Right pleural effusion 2. Right empyema   Procedure (s):  1.Video bronchoscopy.  2. Right VATS (video-assisted thoracoscopic surgery with decortication  of right lower lobe, wedge resection of posterior segment right  lower lobe by Dr. Donata Clay on 10/28/2013.  Pathology: Path Review  Reviewed By Havery Moros, M.D.   Comments: 06.11.15 ABUNDANT NEUTROPHILS PRESENT. NO MALIGNANCY IDENTIFIED.  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 The patient developed symptoms of right pleuritic chest pain, dry cough, minimal hemoptysis, shortness of breath and night sweats in early May. He was training for a Ironman triathlon so he had a chest x-ray. This was normal and blood work that was done was also apparently normal. He was given a course of azithromycin. He completed the Ironman competition in [place]. He noted no injury is or aspiration of the [place] water. In the aid tent after completing the race, his creatinine was 2.5 and he was given IV fluids. He noted some dark urine 1-2 days following the race which cleared. After returning from the Ironman competition, his symptoms returned and were worse. He presented to the ED with shortness of breath. A CT scan performed to rule out PE showed no PE;however, he had a large complex right pleural effusion with some reactive mediastinal nodes. He was admitted to [facility]. He had a thoracentesis which removed 1 L of amber fluid. Cytology was negative for malignancy but with  inflammatory cells. A tuberculin skin test was placed and was apparently negative. Cultures of fluid were negative. CT of the abdomen was unremarkable at the outside hospital. His inflammatory markers were elevated-sedimentation rate and C-reactive protein. No pericardial effusion. His LFTs are unremarkable. Electrolytes normal. White count normal.  He returned back home and has had recurrent symptoms.  He was seen by pulmonary medicine a repeat x-ray showed persistent effusion. An attempt at repeat thoracentesis was unsuccessful due to the loculated nature the fluid. Thoracic surgical evaluation was requested for VATS drainage of the loculated effusion.  The patient persists in having pleuritic pain night sweats. He has not lost weight.  He denies prior injuries to his chest or previous pulmonary infections.  No active dental complaints  No history of cardiac disease Potential risks, benefits, and complications of the right lung surgery were discussed and he agreed to proceed. He underwent a right VATS, decortication, and wedge posterior segment of the RLL on 10/28/2013.  Brief Hospital Course:  An epidural was placed by anesthesia pre op. This remained in post op and Ropivacaine was administered by anesthesia. He was continued on Vanco and Zosyn for few days post op. Cultures showed no growth and pathology showed no malignancy. A line and foley were removed on post op day one. IVF were decreased as he tolerated a diet. Oxy and Ultram made him have nausea so these were stopped. He was given several doses of IV Toradol and then placed on a reduced dose Dilaudid PCA for better control of his pain. Daily chest  x rays were obtained and remained stable. Chest tubes did not have an air leak. Anterior chest tube was removed on 6/14. The remaining chest tube was removed 6/15. PCA was stopped on this date as well. Dilaudid po was given PRN pain. He was ambulating on room air. He remains afebrile and chest x ray is  stable. IV Vanco and Zosyn will be stopped. Antibiotics were then changed to Augmentin and this will be continued for one week at discharge. He was constipated secondary to narcotic use. He was given an enema and Lactulose. He has had a bowel movement. He is felt surgically stable for discharge.   Latest Vital Signs: Blood pressure 122/65, pulse 83, temperature 98.1 F (36.7 C), temperature source Oral, resp. rate 18, height 6\' 2"  (1.88 m), weight 208 lb (94.348 kg), SpO2 96.00%.  Physical Exam: Cardiovascular: RRR  Pulmonary: Mostly clear to auscultation bilaterally; no rales, wheezes, or rhonchi.  Abdomen: Soft, non tender, bowel sounds present.  Extremities: Trace bilateral lower extremity edema.  Wounds: Clean and dry. No erythema or signs of infection.  Chest Tube: to water seal and no air leak   Discharge Condition:Stable  Recent laboratory studies:  Lab Results  Component Value Date   WBC 8.5 10/30/2013   HGB 13.5 10/30/2013   HCT 41.0 10/30/2013   MCV 87.6 10/30/2013   PLT 416* 10/30/2013   Lab Results  Component Value Date   NA 143 10/30/2013   K 4.2 10/30/2013   CL 101 10/30/2013   CO2 29 10/30/2013   CREATININE 1.12 10/30/2013   GLUCOSE 112* 10/30/2013      Diagnostic Studies:  CLINICAL DATA: Status post right-sided VATS and decortication  procedure.  EXAM:  CHEST 2 VIEW  COMPARISON: Chest x-ray 10/31/2013.  FINDINGS:  Mild elevation of the right hemidiaphragm. Linear opacities in the  right base presumably and related to recent decortication procedure  with some associated parenchymal scarring. Small linear scar at the  left base is unchanged. No definite consolidative airspace disease.  No significant right pleural effusion. There may be some  postoperative fluid in the lower right hemithorax which is similar  to decreased in size. No evidence of pulmonary edema. Heart size is  normal. Mediastinal contours are unremarkable.  IMPRESSION:  1. Postoperative  changes of recent right-sided decortication  procedure, as above, without significant interval change compared to  yesterday's examination.  Electronically Signed  By: Trudie Reed M.D.  On: 11/01/2013 08:20    Ct Angio Chest Pe W/cm &/or Wo Cm  10/20/2013   CLINICAL DATA:  One month history of pleuritic right-sided chest discomfort; shortness of breath ; elevated D-dimer  EXAM: CT ANGIOGRAPHY CHEST WITH CONTRAST  TECHNIQUE: Multidetector CT imaging of the chest was performed using the standard protocol during bolus administration of intravenous contrast. Multiplanar CT image reconstructions and MIPs were obtained to evaluate the vascular anatomy.  CONTRAST:  OMNIPAQUE IOHEXOL 350 MG/ML SOLN intravenously  COMPARISON:  Chest x-ray of Sep 22, 2013  FINDINGS: Contrast with the in the pulmonary arterial tree is normal. There are no filling defects to suggest an acute pulmonary embolism. The caliber of the thoracic aorta is normal. There is no false lumen.  There is a new large right-sided pleural effusion with atelectasis of much of the right lower lobe. Mildly enlarged right hilar lymph nodes are suspected on images 51-53. There is no significant mediastinal shift. There is no left pleural effusion. The cardiac chambers are top-normal in size. The thoracic esophagus  is normal.  Within the upper abdomen the observed portions of the liver are grossly normal. The bony thorax is normal.  Review of the MIP images confirms the above findings.  IMPRESSION: 1. There is no acute pulmonary embolism or acute thoracic aortic pathology. 2. The new findings in the right hemi thorax are worrisome for occult malignancy. Cytologic evaluation of the pleural fluid would be a useful next step. 3. There is no evidence of CHF.   Electronically Signed   By: David  SwazilandJordan   On: 10/20/2013 10:23    [place] Thoracentesis Asp Pleural Space W/img Guide  10/26/2013   CLINICAL DATA:  Dyspnea. Right-sided pleural effusion. Request  diagnostic and therapeutic thoracentesis.  EXAM: ULTRASOUND GUIDED RIGHT THORACENTESIS  COMPARISON:  None.  PROCEDURE: An ultrasound guided thoracentesis was thoroughly discussed with the patient and questions answered. The benefits, risks, alternatives and complications were also discussed. The patient understands and wishes to proceed with the procedure. Written consent was obtained.  Ultrasound of the right chest reveals a moderate sized pleural effusion which is severely loculated with innumerable septations.  Ultrasound was performed to localize and mark an adequate pocket of fluid in the right chest. The area was then prepped and draped in the normal sterile fashion. 1% Lidocaine was used for local anesthesia. Under ultrasound guidance a 19 gauge Yueh catheter was introduced. Thoracentesis was performed. The catheter was removed and a dressing applied.  Complications:  None immediate  FINDINGS: A total of only 7 mL of hazy blood-tinged fluid was able to be removed. A fluid sample wassent for laboratory analysis.  IMPRESSION: Successful ultrasound guided right thoracentesis yielding 7 mL of pleural fluid.  Read by: Brayton ElKevin Bruning PA-C   Electronically Signed   By: Malachy MoanHeath  McCullough M.D.   On: 10/26/2013 16:22    Discharge Medications:   Medication List    STOP taking these medications       levofloxacin 500 MG tablet  Commonly known as:  LEVAQUIN      TAKE these medications       amoxicillin-clavulanate 875-125 MG per tablet  Commonly known as:  AUGMENTIN  Take 1 tablet by mouth 2 (two) times daily. For one week then stop.     cetirizine 10 MG tablet  Commonly known as:  ZYRTEC  Take 10 mg by mouth daily as needed for allergies.     HYDROmorphone 2 MG tablet  Commonly known as:  DILAUDID  Take 0.5-1 tablets (1-2 mg total) by mouth every 4 (four) hours as needed for severe pain.     ibuprofen 200 MG tablet  Commonly known as:  ADVIL,MOTRIN  Take 200-800 mg by mouth every 6 (six) hours  as needed (for pain).     ondansetron 4 MG tablet  Commonly known as:  ZOFRAN  Take 1 tablet (4 mg total) by mouth every 8 (eight) hours as needed for nausea or vomiting.     ZANTAC PO  Take 1 tablet by mouth daily as needed (for heart burn).     zolpidem 10 MG tablet  Commonly known as:  AMBIEN  Take 10 mg by mouth at bedtime as needed for sleep.        Follow Up Appointments: Follow-up Information   Follow up with VAN Dinah BeersRIGT III,PETER, MD On 11/23/2013. ([facility] in the same building as Dr. Zenaida NieceVan Trigt's office) on 11/23/2013 at 9:00 am ;Appointment time is at 10:00 am)    Specialty:  Cardiothoracic Surgery   Contact information:   [address] [place] [address] [phone]       Follow up with Mikey BussingVAN TRIGT III,PETER, MD On 11/15/2013. (Appointment is with nurse to have chest tubes sutures removed only at 10:00 am)    Specialty:  Cardiothoracic Surgery   Contact information:   [address] [place] [address] [phone]       Signed: Doree FudgeZIMMERMAN,Derrion Tritz MPA-C 11/01/2013, 2:28 PM

## 2013-10-31 NOTE — Discharge Instructions (Signed)
Video-Assisted Thoracic Surgery °Video-assisted thoracic surgery (VATS) is a procedure that allows your caregiver to look inside your chest and do some minor operations. VATS is commonly done to: °· Study or diagnose problems in the chest. °· Take a tissue sample (biopsy).   °· Put medications directly into the chest.   °· Remove collections of fluid, pus, or blood. °· Remove tumors. ° VATS is done using thoracoscopy. Thoracoscopy is a procedure in which a thin, lighted tube (thoracoscope) is put through a small surgical cut (incision) in your chest wall. The thoracoscope used for VATS has a video camera on it. It allows your caregiver to see the inside of your chest on a television screen. °LET YOUR CAREGIVER KNOW ABOUT:  °· Any allergies you have. °· All medications you are taking, including vitamins, herbs, eyedrops, and over-the-counter medications and creams. °· Use of steroids (by mouth or creams).   °· Previous problems you or members of your family have had with the use of anesthetics. °· Any blood disorders you have had. °· Any blood thinners, like warfarin, that you take. °· Previous surgery.   °· Any history of heart problems. °· Other health problems you °RISKS AND COMPLICATIONS °· Severe bleeding (hemorrhage). °· Injury to nerves or other structures in the chest.   °· Lung infection. °· The chest may need to be opened with a large incision (thoracotomy) if the procedure cannot be completed safely with the thoracoscope. °BEFORE THE PROCEDURE  °· Tests such as blood tests, urine tests, chest X-rays, and electrocardiography may be done. °· Follow your caregiver's instructions if you are taking dietary supplements or medications. Your caregiver may tell you to stop taking them or to reduce your dosage. °· Do not take new dietary supplements or medications within 1 week of your procedure unless your caregiver approves them. °· Do not eat or drink within 8 hours of your procedure or as directed by your  caregiver. °· Do not smoke for as long as possible before your procedure. If possible, stop smoking 3 6 weeks before the procedure. °PROCEDURE  °· You will be given a medication that makes you sleep (general anesthetic) through a mask, through an intravenous (IV) access tube, or through both. This medication will prevent you from being alert and feeling pain during the procedure. Once you are asleep, a breathing tube or mask may be used to help you breathe. °· A video thoracoscope will be placed in a very small incision (less than an inch wide), so that your caregiver can see into your chest. The picture from inside the chest will be displayed on a television screen. °· Other surgical tools will be inserted through the remaining incisions. °· One of your lungs will be deflated as the surgery is performed. Deflating a lung makes it easier for your caregiver to see the area. The lung will be inflated at the end of the procedure. °· The tools will be removed when your caregiver has finished performing the examination or procedure. °· A chest tube will be inserted through an incision to drain the wound. The remaining incisions will be closed with stitches or staples. °AFTER THE PROCEDURE °· Chest tubes are watched closely for signs of fluid or air buildup in the lungs. Your caregiver will most likely remove them prior to discharge. °· You will need to stay in the hospital for 1 5 days. The average time in the hospital with VATS surgery is usually several days less than with standard open surgery. °· You may receive medications to help with pain. °·   It is normal to feel sore for about 2 weeks after the procedure. °Document Released: 08/30/2012 Document Reviewed: 08/30/2012 °[employer]® Patient Information ©2014 [employer]. ° °

## 2013-10-31 NOTE — Progress Notes (Signed)
Wasted 10ml Dilaudid PCA w/ Osvaldo Humanarole Schiller, RN.

## 2013-10-31 NOTE — Progress Notes (Signed)
ANTIBIOTIC CONSULT NOTE - Follow Up  Pharmacy Consult for vancomycin Indication: s/p R VATS for early empyema  No Known Allergies  Patient Measurements: Height: 6\' 2"  (188 cm) Weight: 208 lb (94.348 kg) IBW/kg (Calculated) : 82.2  Vital Signs: Temp: 97.9 F (36.6 C) (06/15 1500) Temp src: Oral (06/15 1500) BP: 124/67 mmHg (06/15 1500) Pulse Rate: 60 (06/15 1500) Intake/Output from previous day: 06/14 0701 - 06/15 0700 In: 998.4 [I.V.:248.4; IV Piggyback:750] Out: 1490 [Urine:1450; Chest Tube:40] Intake/Output from this shift:    Labs:  Recent Labs  10/29/13 0432 10/30/13 0221  WBC 11.3* 8.5  HGB 11.6* 13.5  PLT 335 416*  CREATININE 1.08 1.12   Estimated Creatinine Clearance: 104 ml/min (by C-G formula based on Cr of 1.12).  Recent Labs  10/31/13 1958  VANCOTROUGH 13.3     Microbiology: Recent Results (from the past 720 hour(s))  BODY FLUID CULTURE     Status: None   Collection Time    10/26/13  4:04 PM      Result Value Ref Range Status   Specimen Description PLEURAL   Final   Special Requests NONE   Final   Gram Stain     Final   Value: MODERATE WBC PRESENT,BOTH PMN AND MONONUCLEAR     NO ORGANISMS SEEN     Performed at [facility]   Culture     Final   Value: NO GROWTH 3 DAYS     Performed at [facility]   Report Status 10/30/2013 FINAL   Final  SURGICAL PCR SCREEN     Status: None   Collection Time    10/27/13  1:03 PM      Result Value Ref Range Status   MRSA, PCR NEGATIVE  NEGATIVE Final   Staphylococcus aureus NEGATIVE  NEGATIVE Final   Comment:            The Xpert SA Assay (FDA     approved for NASAL specimens     in patients over 66 years of age),     is one component of     a comprehensive surveillance     program.  Test performance has     been validated by [employer] for patients greater     than or equal to 39 year old.     It is not intended     to diagnose infection nor to     guide or monitor  treatment.  CULTURE, RESPIRATORY (NON-EXPECTORATED)     Status: None   Collection Time    10/28/13  8:09 AM      Result Value Ref Range Status   Specimen Description BRONCHIAL WASHINGS   Final   Special Requests RLL PT ON ZINACEF   Final   Gram Stain     Final   Value: NO WBC SEEN     NO SQUAMOUS EPITHELIAL CELLS SEEN     NO ORGANISMS SEEN     Performed at [facility]   Culture     Final   Value: NO GROWTH 2 DAYS     Performed at [facility]   Report Status 10/30/2013 FINAL   Final  TISSUE CULTURE     Status: None   Collection Time    10/28/13  9:11 AM      Result Value Ref Range Status   Specimen Description TISSUE PLEURAL   Final   Special Requests RIGHT PLEURAL PEEL SAMPLE NO 4  PT ON ZOSYN   Final   Gram Stain     Final   Value: NO WBC SEEN     NO ORGANISMS SEEN     [facility]   Culture     Final   Value: FEW STREPTOCOCCUS GROUP C     [facility]formed at [facility]   Report Status 10/31/2013 FINAL   Final  AFB CULTURE WITH SMEAR     Status: None   Collection Time    10/28/13  9:12 AM      Result Value Ref Range Status   Specimen Description TISSUE PLEURAL RIGHT   Final   Special Requests RIGHT PLEURAL PEEL PT ON ZOSYN SAMPLE NO 5   Final   Acid Fast Smear     Final   Value: NO ACID FAST BACILLI SEEN     Performed at [facility]   Culture     Final   Value: CULTURE WILL BE EXAMINED FOR 6 WEEKS BEFORE ISSUING A FINAL REPORT     Performed at [facility]   Report Status PENDING   Incomplete  FUNGUS CULTURE W SMEAR     Status: None   Collection Time    10/28/13  9:12 AM      Result Value Ref Range Status   Specimen Description TISSUE PLEURAL RIGHT   Final   Special Requests RIGHT PLEURAL PEEL PT ON ZOSYN SAMPLE NO 5   Final   Fungal Smear     Final   Value: NO YEAST OR FUNGAL ELEMENTS SEEN     Performed at [facility]   Culture     Final   Value: CULTURE IN PROGRESS FOR FOUR WEEKS     Performed  at [facility]   Report Status PENDING   Incomplete  TISSUE CULTURE     Status: None   Collection Time    10/28/13  9:12 AM      Result Value Ref Range Status   Specimen Description TISSUE PLEURAL RIGHT   Final   Special Requests RIGHT PLEURAL PEEL PT ON ZOSYN SAMPLE 5   Final   Gram Stain     Final   Value: NO WBC SEEN     NO ORGANISMS SEEN     [facility]formed at [facility]   Culture     Final   Value: NO GROWTH 3 DAYS     Performed at [facility]   Report Status 10/31/2013 FINAL   Final  ANAEROBIC CULTURE     Status: None   Collection Time    10/28/13  9:52 AM      Result Value Ref Range Status   Specimen Description TISSUE RIGHT LUNG   Final   Special Requests RLL  PT ON ZOSYN   Final   Gram Stain     Final   Value: RARE WBC PRESENT, PREDOMINANTLY PMN     NO ORGANISMS SEEN     Performed at [facility]   Culture     Final   Value: NO ANAEROBES ISOLATED; CULTURE IN PROGRESS FOR 5 DAYS     Performed at [facility]   Report Status PENDING   Incomplete  FUNGUS CULTURE W SMEAR     Status: None   Collection Time    10/28/13  9:52 AM      Result Value Ref Range Status   Specimen Description TISSUE RIGHT LUNG   Final  Special Requests RRL  PT ON ZOSYN   Final   Fungal Smear     Final   Value: NO YEAST OR FUNGAL ELEMENTS SEEN     Performed at [facility]   Culture     Final   Value: CULTURE IN PROGRESS FOR FOUR WEEKS     Performed at [facility]   Report Status PENDING   Incomplete  TISSUE CULTURE     Status: None   Collection Time    10/28/13  9:52 AM      Result Value Ref Range Status   Specimen Description TISSUE RIGHT LUNG   Final   Special Requests RLL  PT ON ZOSYN   Final   Gram Stain     Final   Value: RARE WBC PRESENT, PREDOMINANTLY PMN     NO ORGANISMS SEEN     [facility]formed at [facility]   Culture     Final   Value: NO GROWTH 3 DAYS     Performed at [facility]   Report Status  10/31/2013 FINAL   Final  TISSUE CULTURE     Status: None   Collection Time    10/28/13  9:55 AM      Result Value Ref Range Status   Specimen Description TISSUE RIGHT LUNG   Final   Special Requests RIGHT LOWER LOBE FOR CX PT ON ZOYSN   Final   Gram Stain     Final   Value: NO WBC SEEN     NO ORGANISMS SEEN     [facility]formed at [facility]   Culture     Final   Value: NO GROWTH 3 DAYS     [facility]formed at [facility]   Report Status 10/31/2013 FINAL   Final    Medical History: Past Medical History  Diagnosis Date  . Pleural effusion     Medications:  Prescriptions prior to admission  Medication Sig Dispense Refill  . cetirizine (ZYRTEC) 10 MG tablet Take 10 mg by mouth daily as needed for allergies.       Marland Kitchen. ibuprofen (ADVIL,MOTRIN) 200 MG tablet Take 200-800 mg by mouth every 6 (six) hours as needed (for pain).      Marland Kitchen. levofloxacin (LEVAQUIN) 500 MG tablet Take 1 tablet (500 mg total) by mouth daily.  7 tablet  0  . Ranitidine HCl (ZANTAC PO) Take 1 tablet by mouth daily as needed (for heart burn).       . zolpidem (AMBIEN) 10 MG tablet Take 10 mg by mouth at bedtime as needed for sleep.       Assessment: 38 y/o male with a R pleural effusion s/p thoracentesis on 6/10 who presented with recurrent symptoms. He is now s/p R VATS. Patient is currently on Vancomycin 1 gm IV Q 8 hours and Zosyn 3.375 gm IV Q 8 hours. Renal fx remains table. WBC has trended down to normal limits. Pt remains afebrile. Vanc trough today was sub-therapeutic at 13.3.  Goal of Therapy:  Vancomycin trough level 15-20 mcg/ml  Plan:  - Increase Vancomycin to 1250 mg IV Q 8 hours  - Monitor renal function, clinical progress, and culture data - F/u Vanc trough as needed   Vinnie LevelBenjamin Aiya Keach, PharmD.  Clinical Pharmacist Pager [phone]

## 2013-11-01 ENCOUNTER — Inpatient Hospital Stay (HOSPITAL_COMMUNITY): Payer: PRIVATE HEALTH INSURANCE

## 2013-11-01 MED ORDER — LACTULOSE 10 GM/15ML PO SOLN
30.0000 g | Freq: Once | ORAL | Status: AC
  Administered 2013-11-01: 30 g via ORAL
  Filled 2013-11-01: qty 45

## 2013-11-01 MED ORDER — ONDANSETRON HCL 4 MG PO TABS: 4.0000 mg | ORAL_TABLET | Freq: Three times a day (TID) | ORAL | Status: DC | PRN

## 2013-11-01 MED ORDER — FLEET ENEMA 7-19 GM/118ML RE ENEM
1.0000 | ENEMA | Freq: Once | RECTAL | Status: AC
  Administered 2013-11-01: 1 via RECTAL
  Filled 2013-11-01: qty 1

## 2013-11-01 MED ORDER — HYDROMORPHONE HCL 2 MG PO TABS: 1.0000 mg | ORAL_TABLET | ORAL | Status: DC | PRN

## 2013-11-01 MED ORDER — AMOXICILLIN-POT CLAVULANATE 875-125 MG PO TABS: 1.0000 | ORAL_TABLET | Freq: Two times a day (BID) | ORAL | Status: DC

## 2013-11-01 MED ORDER — ONDANSETRON HCL 4 MG/2ML IJ SOLN
4.0000 mg | Freq: Four times a day (QID) | INTRAMUSCULAR | Status: DC | PRN
  Administered 2013-11-01: 4 mg via INTRAVENOUS
  Filled 2013-11-01: qty 2

## 2013-11-01 MED ORDER — FLEET ENEMA 7-19 GM/118ML RE ENEM
1.0000 | ENEMA | Freq: Once | RECTAL | Status: AC
  Administered 2013-11-01: 1 via RECTAL

## 2013-11-01 MED ORDER — ONDANSETRON HCL 4 MG/2ML IJ SOLN
INTRAMUSCULAR | Status: AC
  Administered 2013-11-01: 4 mg via INTRAVENOUS
  Filled 2013-11-01: qty 2

## 2013-11-01 NOTE — Progress Notes (Addendum)
Pt order to D/c home. Pt VSS. Pt verbalized understanding of discharge instruction and prescriptions given. All questions answered. Pt refused to wear telemetry. MD aware. Pt to go home after BM. See Mar.

## 2013-11-01 NOTE — Progress Notes (Addendum)
      [address]       [place] [id number]             [phone]       4 Days Post-Op Procedure(s) (LRB): VIDEO ASSISTED THORACOSCOPY (VATS)/DECORTICATION (Right) VIDEO BRONCHOSCOPY (N/A) WEDGE RESECTION RIGHT LOWER LOBE (Right)  Subjective: Patient straining on toilet. He is very constipated.He manually partially disimpacted himself.  Objective: Vital signs in last 24 hours: Temp:  [97.9 F (36.6 C)-98.5 F (36.9 C)] 98.5 F (36.9 C) (06/16 0700) Pulse Rate:  [58-83] 83 (06/16 0430) Cardiac Rhythm:  [-] Normal sinus rhythm (06/16 0730) Resp:  [12-22] 18 (06/16 0730) BP: (101-134)/(59-72) 122/65 mmHg (06/16 0730) SpO2:  [95 %-97 %] 96 % (06/16 0430)      Intake/Output from previous day: 06/15 0701 - 06/16 0700 In: 1253.7 [P.O.:840; I.V.:163.7; IV Piggyback:250] Out: 482 [Urine:452; Chest Tube:30]   Physical Exam:  Cardiovascular: RRR Pulmonary: Mostly clear to auscultation bilaterally; no rales, wheezes, or rhonchi. Abdomen: Soft, non tender, bowel sounds present. Extremities: Nolower extremity edema. Wounds: Clean and dry.  No erythema or signs of infection.   Lab Results: CBC:  Recent Labs  10/30/13 0221  WBC 8.5  HGB 13.5  HCT 41.0  PLT 416*   BMET:   Recent Labs  10/30/13 0221  NA 143  K 4.2  CL 101  CO2 29  GLUCOSE 112*  BUN 11  CREATININE 1.12  CALCIUM 9.3    PT/INR: No results found for this basename: LABPROT, INR,  in the last 72 hours ABG:  INR: Will add last result for INR, ABG once components are confirmed Will add last 4 CBG results once components are confirmed  Assessment/Plan:  1. CV - SR 2.  Pulmonary - CXR shows no pneumothorax, bibasilar atelectasis, and stable cardiomegaly. No malignancy on path. No AFB and no growth yet on cultures. 3. Epidural per anesthesia 4.ID- Per Dr. Donata ClayVan Trigt, Augmentin for one week at discharge 5. LOC constipation 6. Per patient request, Zofran PRN at discharge 6.  Possible discharge later today vs am  ZIMMERMAN,DONIELLE MPA-C 11/01/2013,8:42 AM

## 2013-11-02 LAB — ANAEROBIC CULTURE

## 2013-11-09 NOTE — Discharge Summary (Signed)
patient examined and medical record reviewed,agree with above note. VAN TRIGT III,PETER 11/09/2013   

## 2013-11-22 ENCOUNTER — Other Ambulatory Visit: Payer: Self-pay | Admitting: Cardiothoracic Surgery

## 2013-11-22 DIAGNOSIS — J90 Pleural effusion, not elsewhere classified: Secondary | ICD-10-CM

## 2013-11-23 ENCOUNTER — Encounter: Payer: Self-pay | Admitting: Cardiothoracic Surgery

## 2013-11-23 ENCOUNTER — Ambulatory Visit
Admission: RE | Admit: 2013-11-23 | Discharge: 2013-11-23 | Disposition: A | Discharge status: Home / Self Care | Payer: PRIVATE HEALTH INSURANCE | Source: Ambulatory Visit | Attending: Cardiothoracic Surgery | Admitting: Cardiothoracic Surgery

## 2013-11-23 ENCOUNTER — Ambulatory Visit (INDEPENDENT_AMBULATORY_CARE_PROVIDER_SITE_OTHER): Payer: Self-pay | Admitting: Cardiothoracic Surgery

## 2013-11-23 VITALS — BP 127/80 | HR 60 | Resp 20 | Ht 74.0 in | Wt 208.0 lb

## 2013-11-23 DIAGNOSIS — J90 Pleural effusion, not elsewhere classified: Secondary | ICD-10-CM

## 2013-11-23 DIAGNOSIS — J869 Pyothorax without fistula: Secondary | ICD-10-CM

## 2013-11-23 DIAGNOSIS — Z09 Encounter for follow-up examination after completed treatment for conditions other than malignant neoplasm: Secondary | ICD-10-CM

## 2013-11-23 NOTE — Progress Notes (Signed)
PCP is [name],MICHAEL C, MD Referring Provider is Oretha MilchAlva, Rakesh V, MD  Chief Complaint  Patient presents with  . 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 Pleural effusion     Past Surgical History  Procedure Laterality Date  . Vasectomy      2012  . Video assisted thoracoscopy (vats)/decortication Right 10/28/2013    Procedure: VIDEO ASSISTED THORACOSCOPY (VATS)/DECORTICATION;  Surgeon: Kerin PernaPeter Van Trigt, MD;  [facility];  Service: Thoracic;  Laterality: Right;  EPIDURAL ANESTHESIA PER DR VANTRIGT  . Video bronchoscopy N/A 10/28/2013    Procedure: VIDEO BRONCHOSCOPY;  Surgeon: Kerin PernaPeter Van Trigt, MD;  [facility];  Service: Thoracic;  Laterality: N/A;  . Wedge resection Right 10/28/2013    Procedure: WEDGE RESECTION RIGHT LOWER LOBE;  Surgeon: Kerin PernaPeter Van Trigt, MD;  [facility];  Service: Thoracic;  Laterality: Right;    Family History  Problem Relation Age of Onset  . Sudden death Neg Hx   . Heart attack Neg Hx   . Hyperlipidemia Neg Hx   . Hypertension Neg Hx   . Diabetes Neg Hx   . Breast cancer Maternal Grandmother   . Liver disease Paternal Grandfather   . Cancer Mother     Social History History  Substance Use Topics  . Smoking status: Never Smoker   . Smokeless tobacco: Never Used  . Alcohol Use: No    Current Outpatient Prescriptions  Medication Sig Dispense Refill  . cetirizine (ZYRTEC) 10 MG tablet Take 10 mg by mouth daily as needed for allergies.       Marland Kitchen. ibuprofen  (ADVIL,MOTRIN) 200 MG tablet Take 200-800 mg by mouth every 6 (six) hours as needed (for pain).      . ondansetron (ZOFRAN) 4 MG tablet Take 1 tablet (4 mg total) by mouth every 8 (eight) hours as needed for nausea or vomiting.  10 tablet  0  . Ranitidine HCl (ZANTAC PO) Take 1 tablet by mouth daily as needed (for heart burn).       . zolpidem (AMBIEN) 10 MG tablet Take 10 mg by mouth at bedtime as needed for sleep.       No current facility-administered medications for this visit.    No Known Allergies  Review of Systems improved appetite sleeping and energy Pain in right latissimus when he raises right arm  BP 127/80  Pulse 60  Resp 20  Ht 6\' 2"  (1.88 m)  Wt 208 lb (94.348 kg)  BMI 26.69 kg/m2  SpO2 98% Physical Exam Alert and comfortable Lungs clear Surgical incision well-healed Heart rate regular murmur or gallop  Diagnostic Tests: Chest x-ray with improved aeration right base Mild pleural thickening with blunting of costophrenic angle on right side Pathology report reviewed with patient Impression: Doing well 6 months postop Will return with x-ray in 8 weeks No heavy lifting for 3 months after surgery

## 2013-11-25 LAB — FUNGUS CULTURE W SMEAR
Fungal Smear: NONE SEEN
Fungal Smear: NONE SEEN

## 2013-11-29 NOTE — Anesthesia Procedure Notes (Signed)
Epidural Patient location during procedure: pre-op Start time: 10/28/2013 7:01 AM End time: 10/28/2013 7:15 AM  Staffing Anesthesiologist: Jailin Manocchio, CHRIS Performed by: anesthesiologist   Preanesthetic Checklist Completed: patient identified, surgical consent, pre-op evaluation, timeout performed, IV checked, risks and benefits discussed and monitors and equipment checked  Epidural Patient position: sitting Prep: site prepped and draped and DuraPrep Patient monitoring: heart rate, cardiac monitor, continuous pulse ox and blood pressure Approach: midline Location: thoracic (1-12) Injection technique: LOR saline  Needle:  Needle type: Tuohy  Needle gauge: 17 G Needle length: 9 cm Needle insertion depth: 6 cm Catheter type: closed end flexible Catheter size: 19 Gauge Catheter at skin depth: 12 cm Test dose: Other  Assessment Events: blood not aspirated, injection not painful, no injection resistance, negative IV test and no paresthesia  Additional Notes H+P and labs checked, risks and benefits discussed with the patient, consent obtained, procedure tolerated well and without complications.  Reason for block:procedure for pain

## 2013-11-29 NOTE — Addendum Note (Signed)
Addendum created 11/29/13 0607 by Corky Soxhris Raizy Auzenne, MD   Modules edited: Anesthesia Blocks and Procedures, Anesthesia LDA, Clinical Notes   Clinical Notes:  File: [id number]

## 2013-12-10 LAB — ACID-FAST (MYCOBACTERIA) SMEAR AND CULTURE WITH REFLEX TO IDENTIFICATION: Acid Fast Smear: NONE SEEN

## 2014-01-18 ENCOUNTER — Other Ambulatory Visit: Payer: Self-pay | Admitting: Cardiothoracic Surgery

## 2014-01-18 DIAGNOSIS — J90 Pleural effusion, not elsewhere classified: Secondary | ICD-10-CM

## 2014-01-25 ENCOUNTER — Encounter: Payer: Self-pay | Admitting: Cardiothoracic Surgery

## 2014-01-25 ENCOUNTER — Ambulatory Visit (INDEPENDENT_AMBULATORY_CARE_PROVIDER_SITE_OTHER): Payer: Self-pay | Admitting: Cardiothoracic Surgery

## 2014-01-25 ENCOUNTER — Ambulatory Visit
Admission: RE | Admit: 2014-01-25 | Discharge: 2014-01-25 | Disposition: A | Discharge status: Home / Self Care | Payer: PRIVATE HEALTH INSURANCE | Source: Ambulatory Visit | Attending: Cardiothoracic Surgery | Admitting: Cardiothoracic Surgery

## 2014-01-25 VITALS — Ht 74.0 in | Wt 208.0 lb

## 2014-01-25 DIAGNOSIS — J90 Pleural effusion, not elsewhere classified: Secondary | ICD-10-CM

## 2014-01-25 DIAGNOSIS — J869 Pyothorax without fistula: Secondary | ICD-10-CM

## 2014-01-25 NOTE — Progress Notes (Signed)
PCP is Eartha Inch, MD Referring Provider is Oretha Milch, MD  Chief Complaint  Patient presents with  . F/U THORACIC    2 MO F/U GDC    HPI: 3 month followup after right VATS for decortication of empyema. Patient doing well now. No recurrent symptoms except for some postthoracotomy dysesthesia.  He plans on running a triathlon soon.    Past Medical History  Diagnosis Date  . Pleural effusion     Past Surgical History  Procedure Laterality Date  . Vasectomy      2012  . Video assisted thoracoscopy (vats)/decortication Right 10/28/2013    Procedure: VIDEO ASSISTED THORACOSCOPY (VATS)/DECORTICATION;  Surgeon: Kerin Perna, MD;  [facility];  Service: Thoracic;  Laterality: Right;  EPIDURAL ANESTHESIA PER DR Imanuel Pruiett  . Video bronchoscopy N/A 10/28/2013    Procedure: VIDEO BRONCHOSCOPY;  Surgeon: Kerin Perna, MD;  Location: [facility] OR;  Service: Thoracic;  Laterality: N/A;  . Wedge resection Right 10/28/2013    Procedure: WEDGE RESECTION RIGHT LOWER LOBE;  Surgeon: Kerin Perna, MD;  Location: [facility] OR;  Service: Thoracic;  Laterality: Right;    Family History  Problem Relation Age of Onset  . Sudden death Neg Hx   . Heart attack Neg Hx   . Hyperlipidemia Neg Hx   . Hypertension Neg Hx   . Diabetes Neg Hx   . Breast cancer Maternal Grandmother   . Liver disease Paternal Grandfather   . Cancer Mother     Social History History  Substance Use Topics  . Smoking status: Never Smoker   . Smokeless tobacco: Never Used  . Alcohol Use: No    Current Outpatient Prescriptions  Medication Sig Dispense Refill  . cetirizine (ZYRTEC) 10 MG tablet Take 10 mg by mouth daily as needed for allergies.       Marland Kitchen ibuprofen (ADVIL,MOTRIN) 200 MG tablet Take 200-800 mg by mouth every 6 (six) hours as needed (for pain).      . ondansetron (ZOFRAN) 4 MG tablet Take 1 tablet (4 mg total) by mouth every 8 (eight) hours as needed for nausea or vomiting.  10 tablet  0  . Ranitidine  HCl (ZANTAC PO) Take 1 tablet by mouth daily as needed (for heart burn).       . zolpidem (AMBIEN) 10 MG tablet Take 10 mg by mouth at bedtime as needed for sleep.       No current facility-administered medications for this visit.    No Known Allergies  Review of SystemsNo fever good appetite good overall strength and stamina  Ht  (1.88 m)  Wt 208 lb (94.348 kg)  BMI 26.69 kg/m2 Physical Exam Alert and comfortable Lungs clear Incision well-healed Heart rhythm regular  Diagnostic Tests: Chest x-ray clear  Impression: Resolution of empyema with good recovery from surgery  Plan:Return as needed. No limits to his activities.

## 2014-08-09 ENCOUNTER — Other Ambulatory Visit: Payer: Self-pay | Admitting: Pulmonary Disease

## 2014-08-09 DIAGNOSIS — J941 Fibrothorax: Secondary | ICD-10-CM

## 2014-08-09 DIAGNOSIS — J929 Pleural plaque without asbestos: Secondary | ICD-10-CM

## 2015-04-17 IMAGING — CR DG CHEST 1V PORT
2 series · 2 of 2 positions shown · non-contrast
Comparison: 10/28/2013

CLINICAL DATA: Chest surgery

EXAM:
PORTABLE CHEST - 1 VIEW

[AP (1 of 2)]
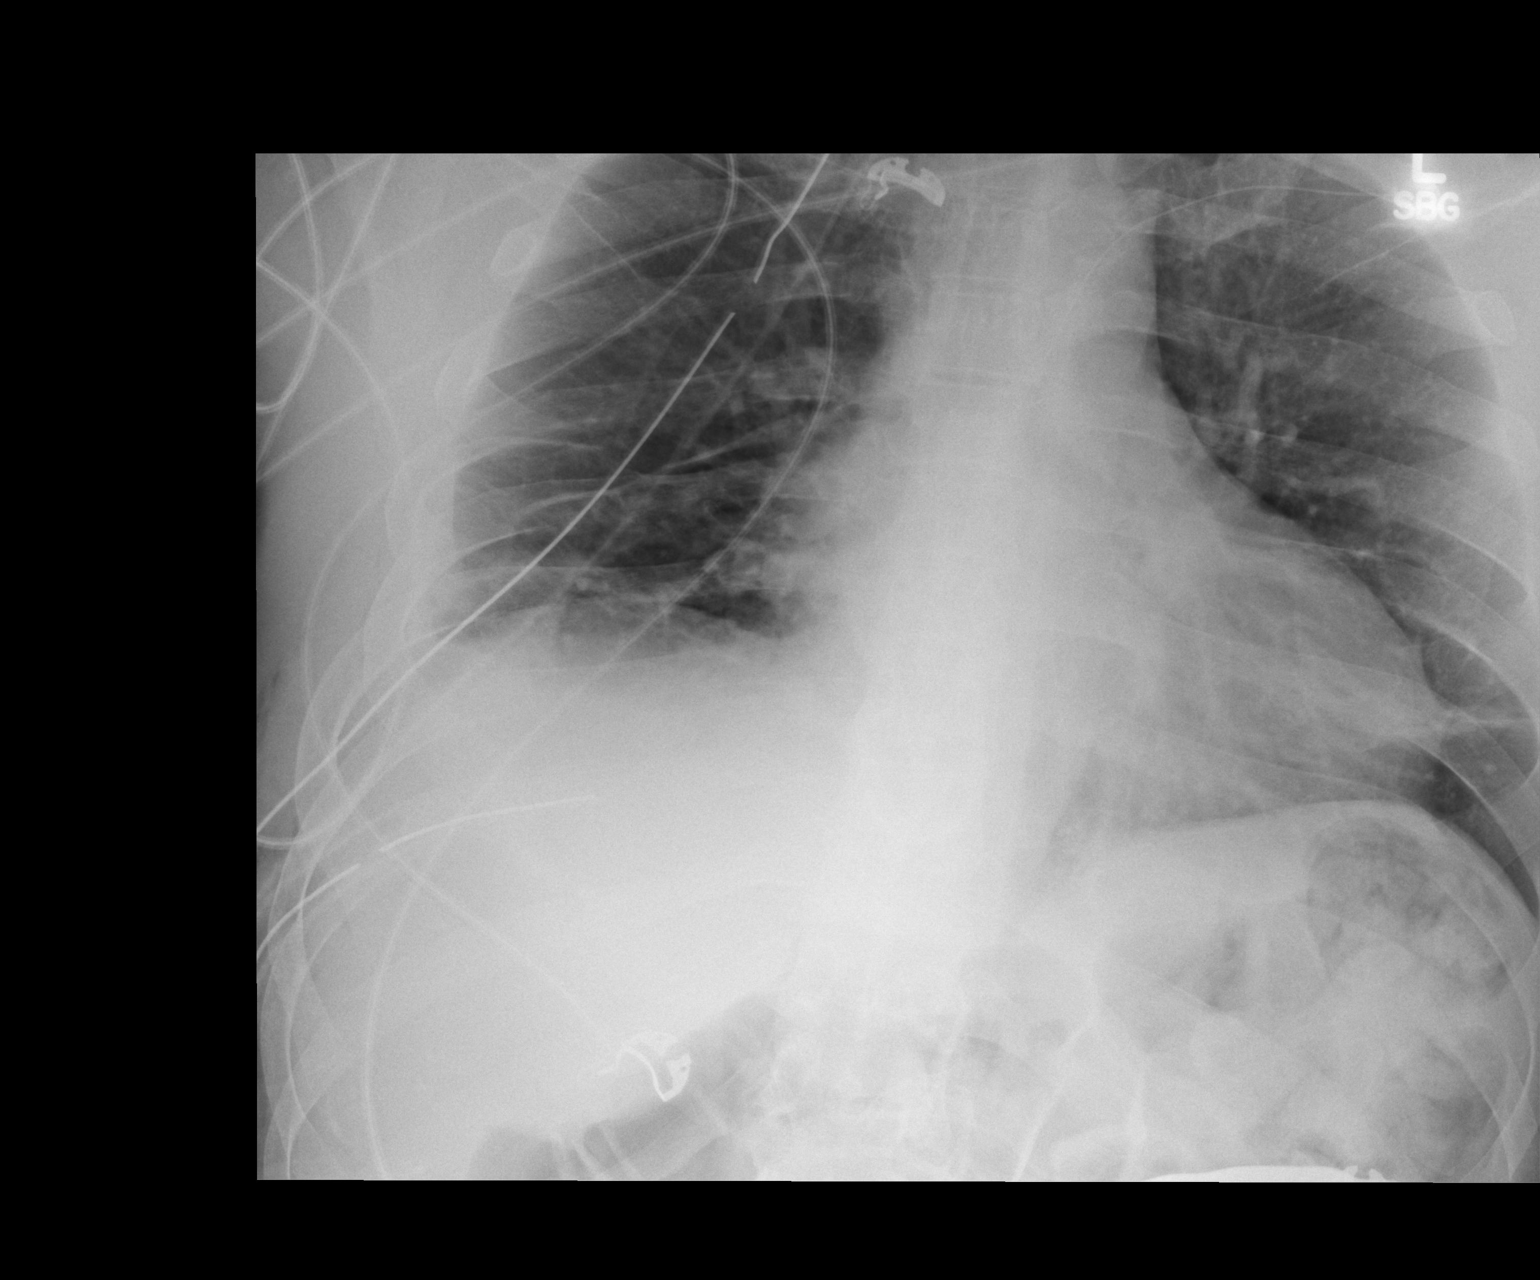

[AP (2 of 2)]
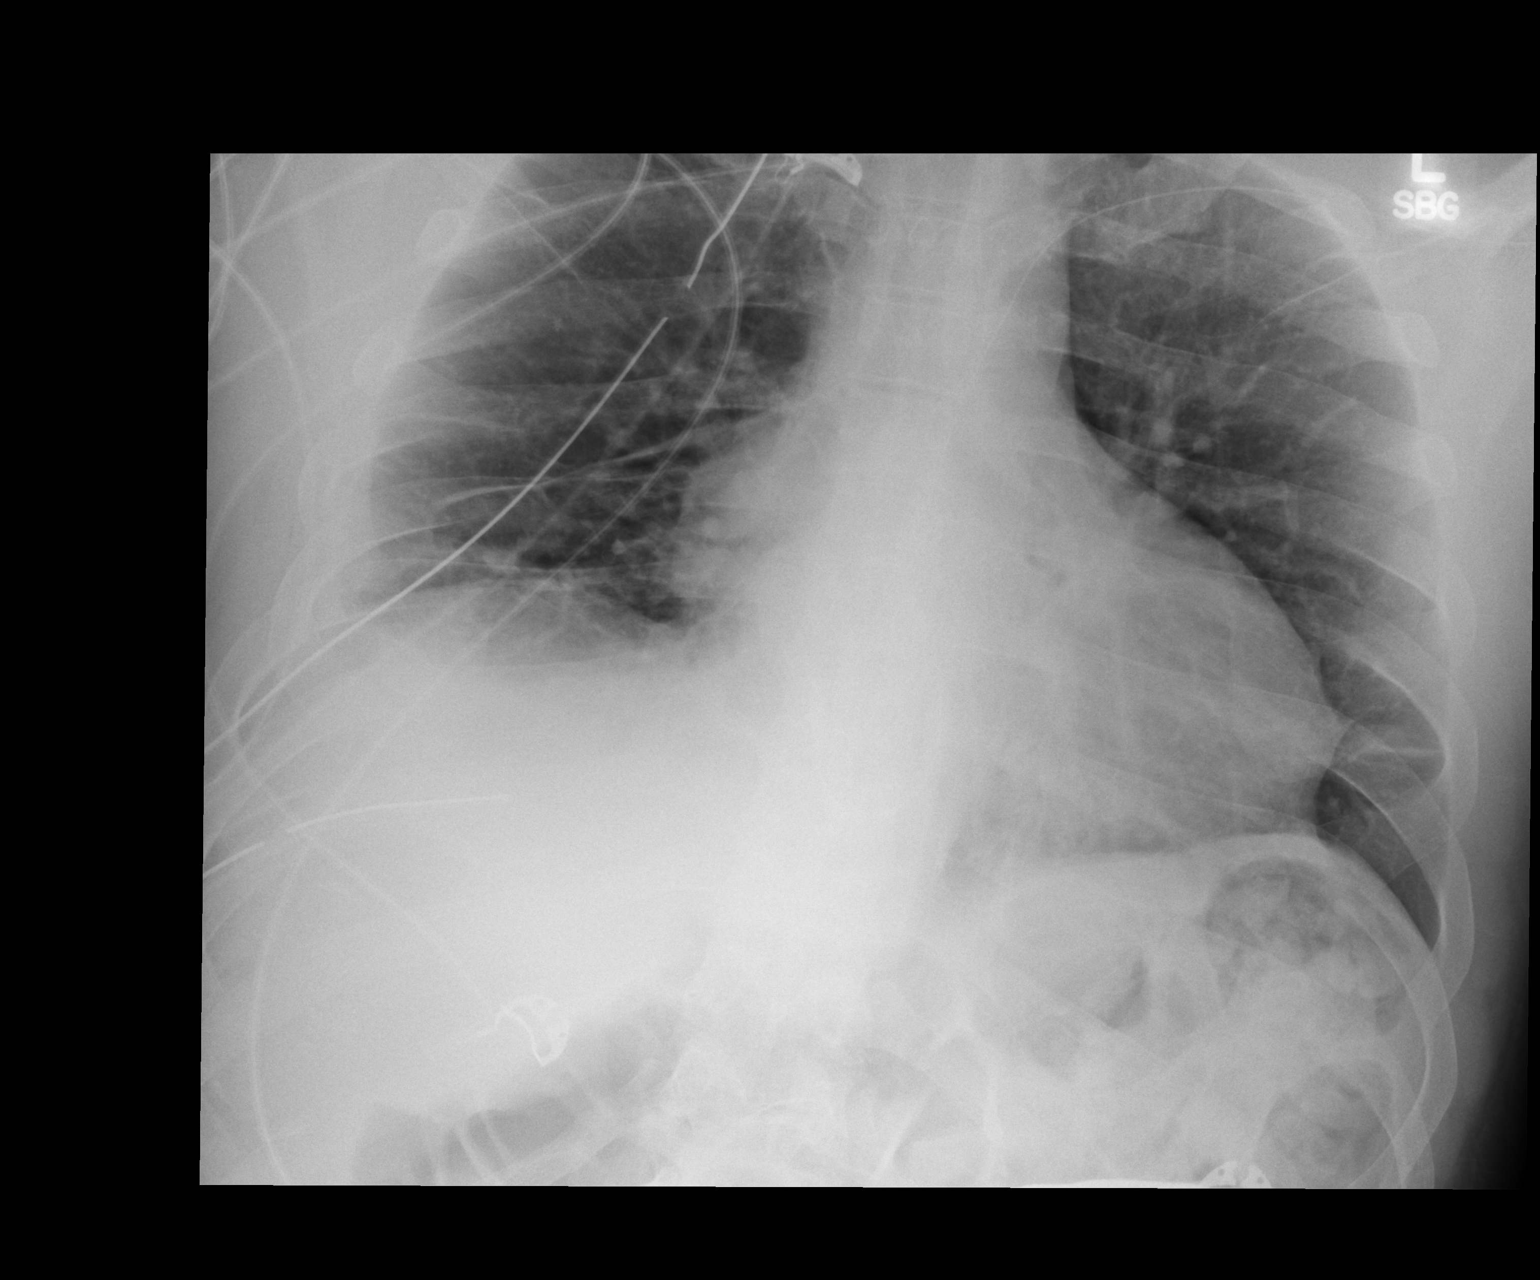

[2 of 2 positions shown; findings below may reference images not displayed]

FINDINGS: Interval extubation.

Two right-sided chest tubes remain in unchanged position.
Generalized improvement in lung aeration.There is mild postoperative
atelectasis at the right more than left base. No evidence of air
leak. Small right pleural effusion or pleural thickening appears
stable.

Thin wire overlapping the left upper chest is presumably external,
as discussed RN Tiger, there is no central vascular access in place.
IMPRESSION: Improving lung aeration.  No increasing pleural fluid.

## 2015-04-18 IMAGING — CR DG CHEST 1V PORT
1 series · 1 of 1 positions shown · non-contrast
Comparison: 10/29/2013.

CLINICAL DATA: Chest surgery.

EXAM:
PORTABLE CHEST - 1 VIEW

[AP]
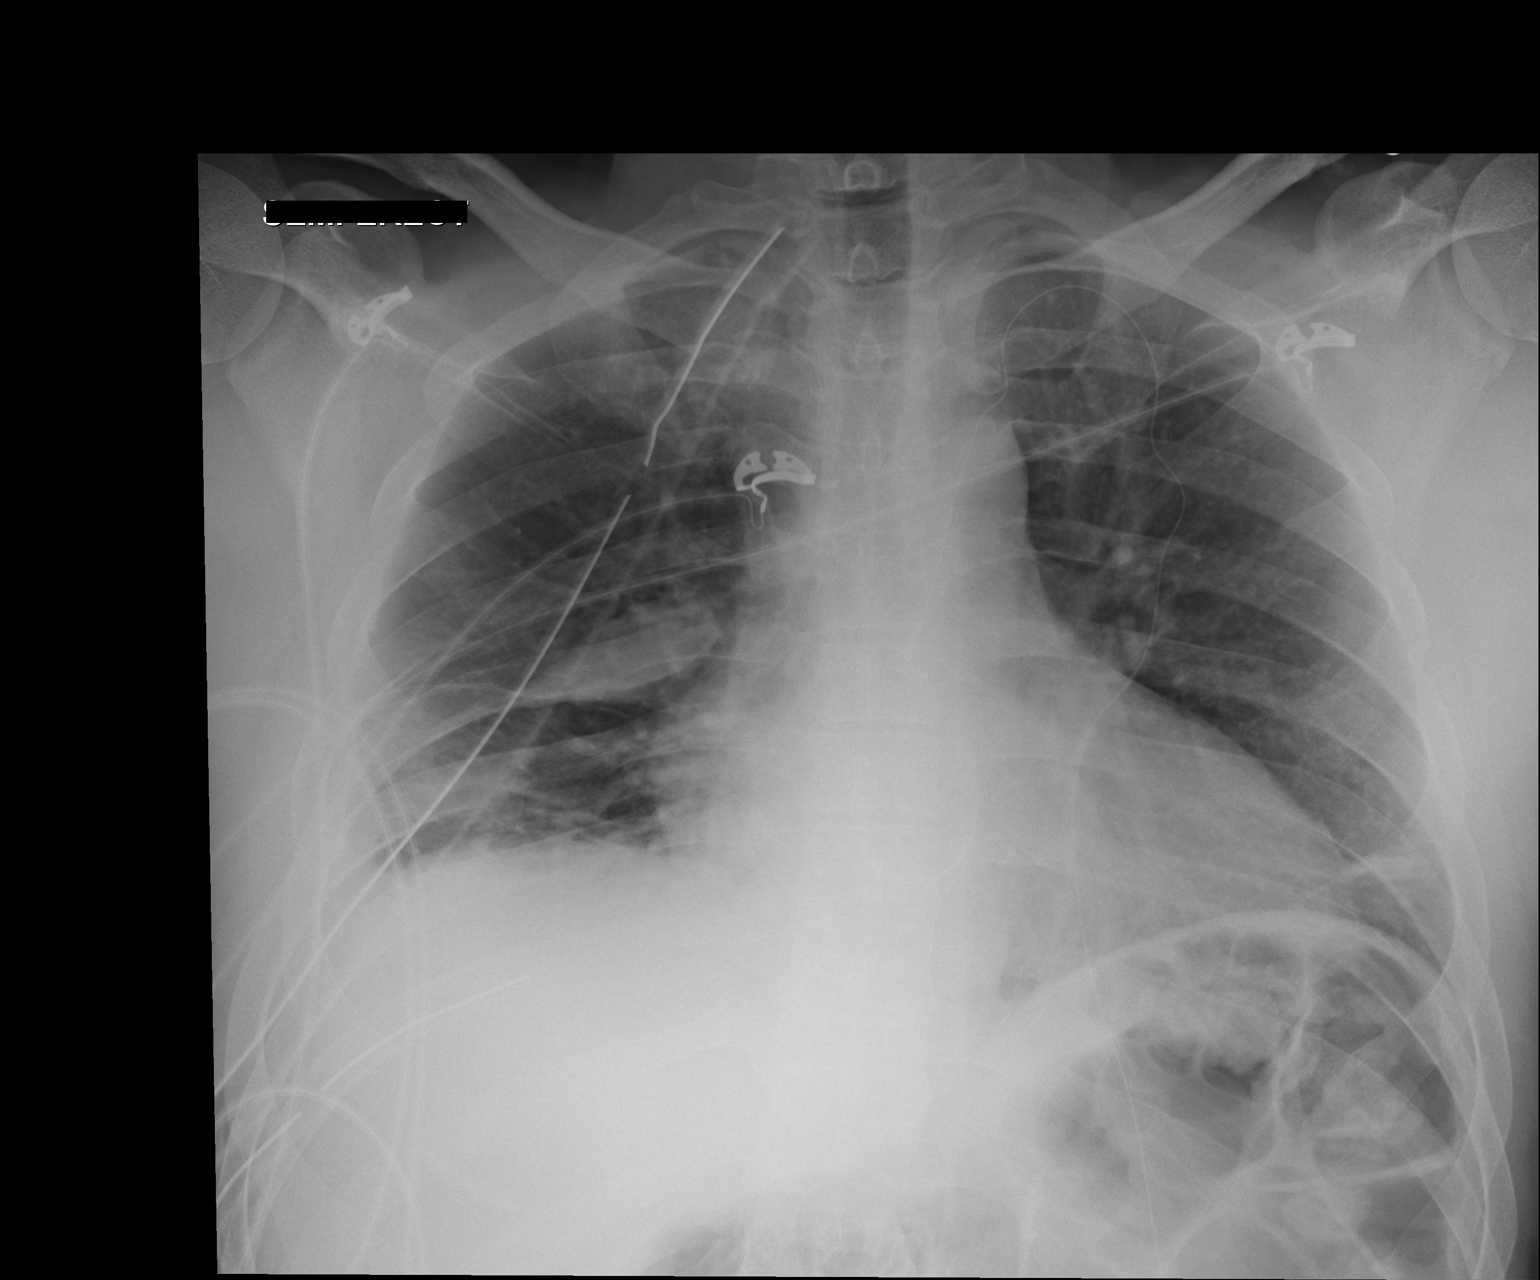

[1 of 1 positions shown; findings below may reference images not displayed]

FINDINGS: Right chest tubes in stable position. No pneumothorax. Bibasilar
atelectasis. Small right pleural effusion. Small amount of right
fissural fluid noted. Stable cardiomegaly. Normal pulmonary
vascularity. External wiring noted over the left chest.
IMPRESSION: 1. 2 right chest tubes in stable position.  No pneumothorax.
2. Bibasilar atelectasis.  Small right pleural effusion .
3. Stable cardiomegaly.  Normal pulmonary vascularity.

## 2015-04-20 IMAGING — CR DG CHEST 2V
2 series · 2 of 2 positions shown · non-contrast
Comparison: Chest x-ray 10/31/2013.

CLINICAL DATA: Status post right-sided VATS and decortication
procedure.

EXAM:
CHEST  2 VIEW

[w chest pa]
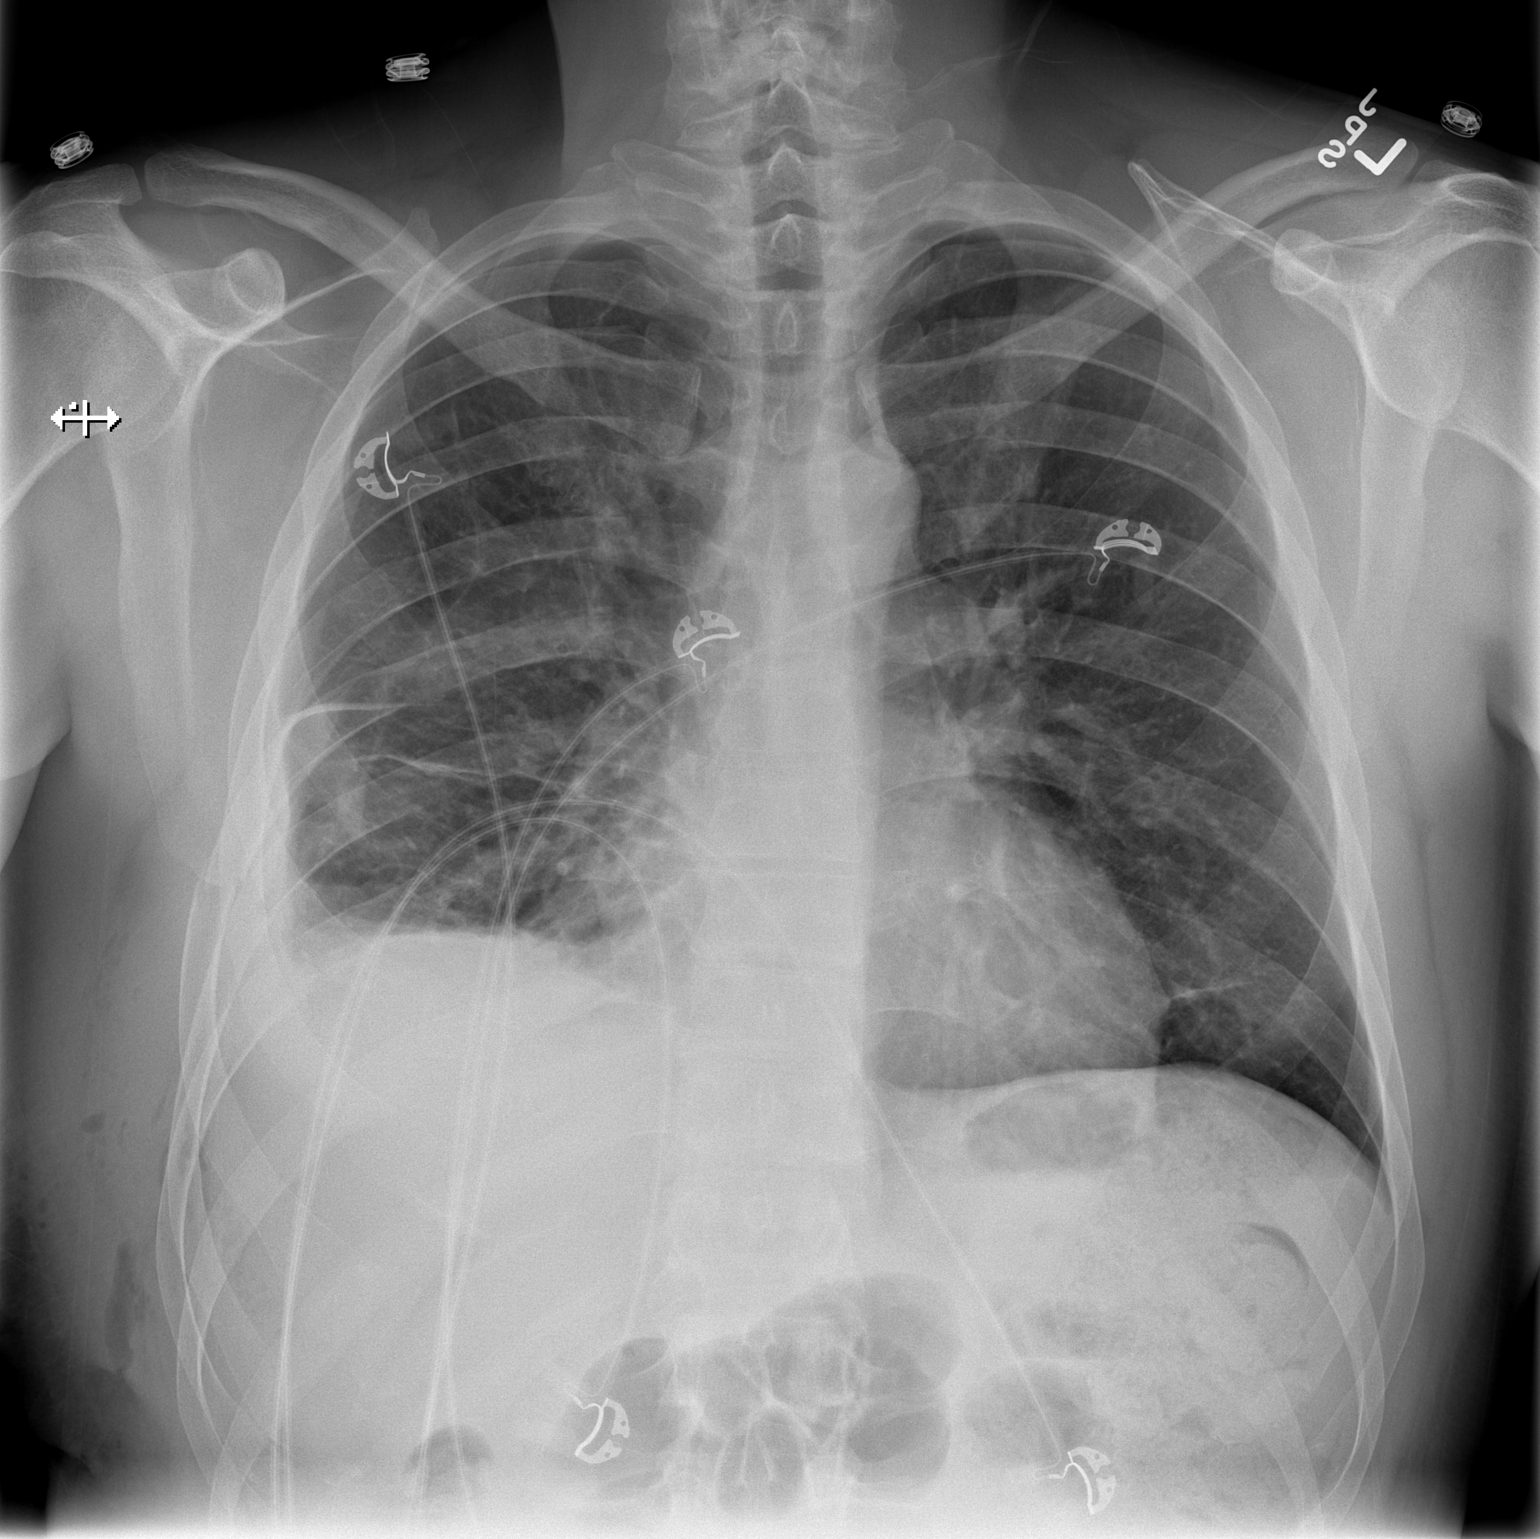

[w chest lat]
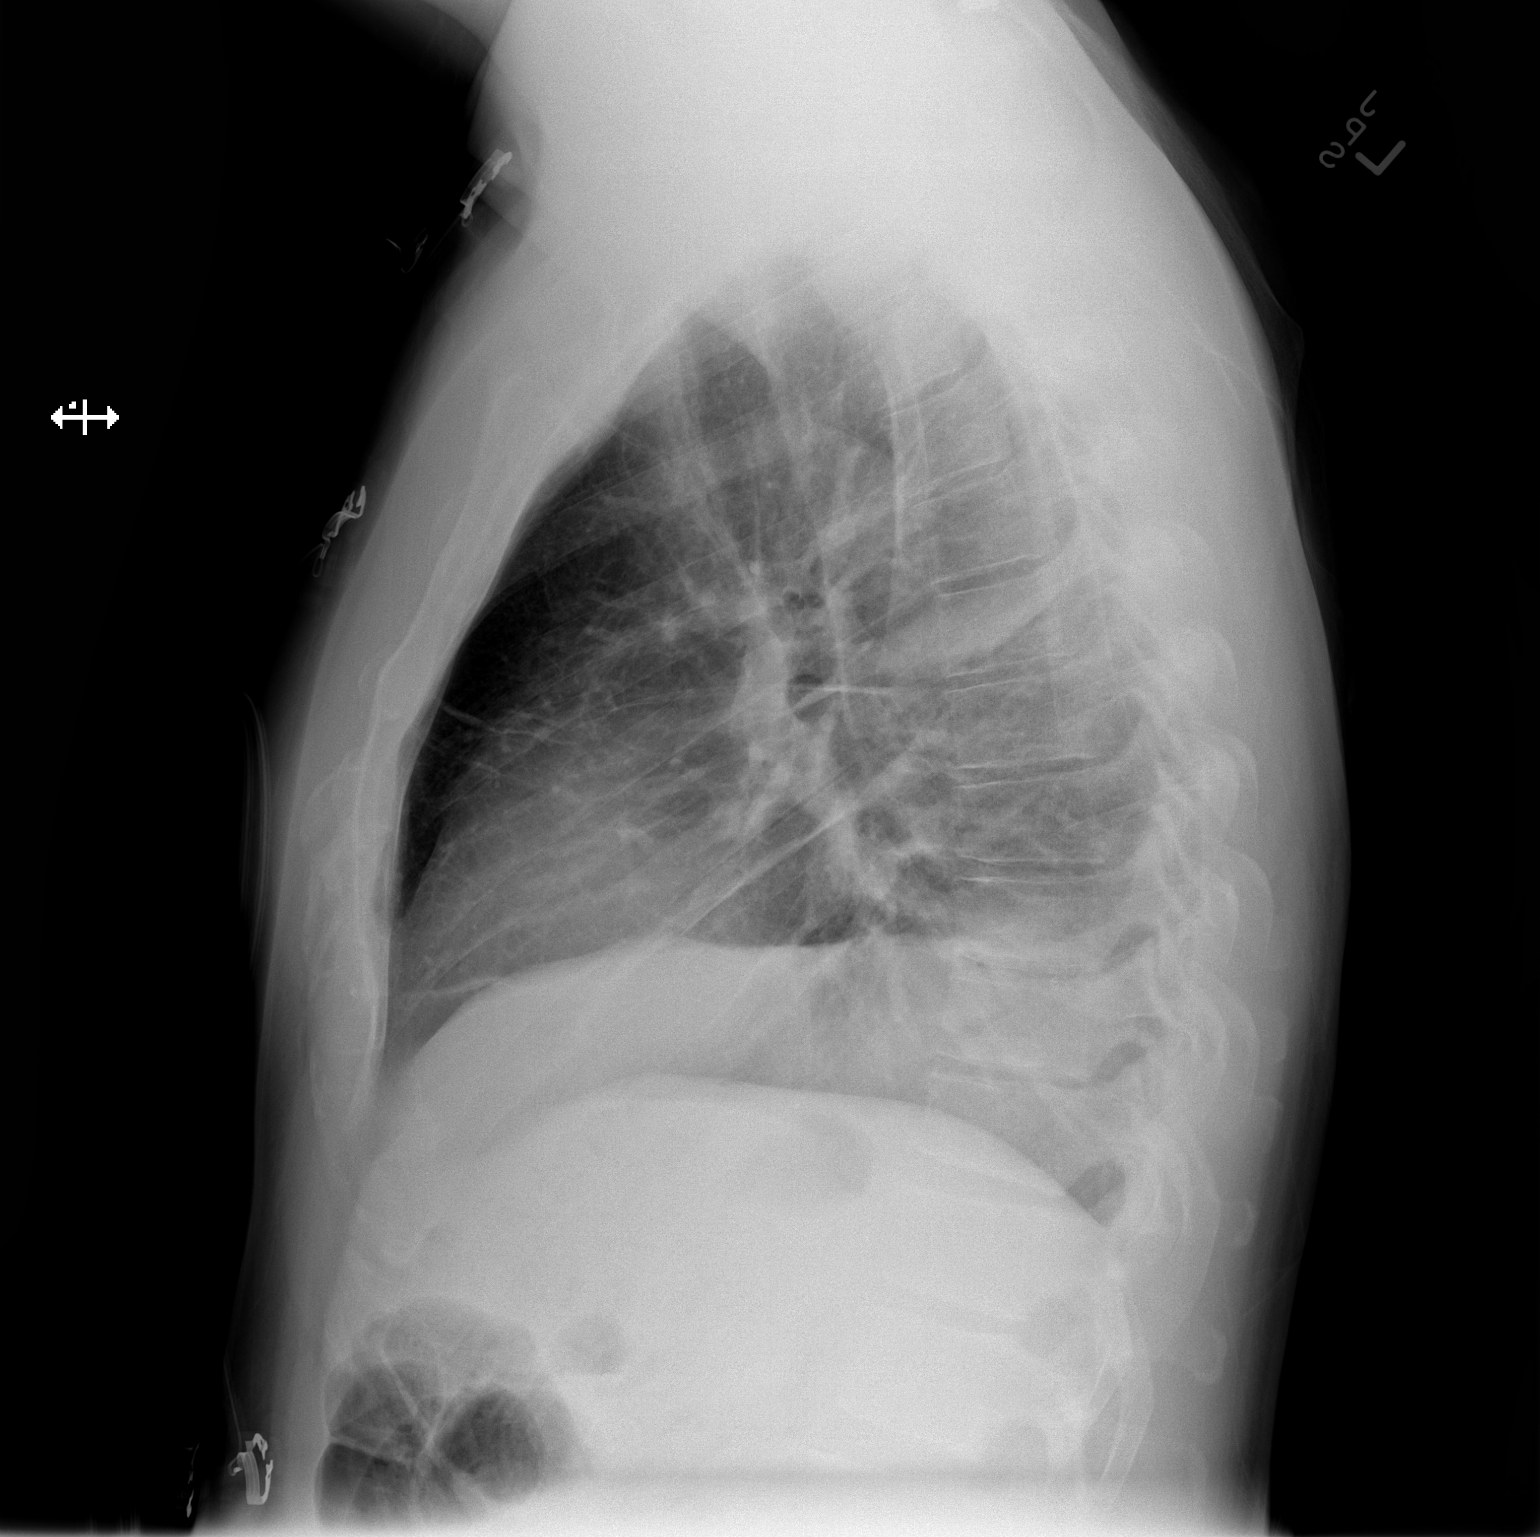

[2 of 2 positions shown; findings below may reference images not displayed]

FINDINGS: Mild elevation of the right hemidiaphragm. Linear opacities in the
right base presumably and related to recent decortication procedure
with some associated parenchymal scarring. Small linear scar at the
left base is unchanged. No definite consolidative airspace disease.
No significant right pleural effusion. There may be some
postoperative fluid in the lower right hemithorax which is similar
to decreased in size. No evidence of pulmonary edema. Heart size is
normal. Mediastinal contours are unremarkable.
IMPRESSION: 1. Postoperative changes of recent right-sided decortication
procedure, as above, without significant interval change compared to
yesterday's examination.

## 2019-04-08 ENCOUNTER — Other Ambulatory Visit: Payer: Self-pay

## 2019-04-08 DIAGNOSIS — Z20822 Contact with and (suspected) exposure to covid-19: Secondary | ICD-10-CM

## 2019-04-10 LAB — NOVEL CORONAVIRUS, NAA: SARS-CoV-2, NAA: NOT DETECTED

## 2019-11-10 ENCOUNTER — Telehealth: Payer: PRIVATE HEALTH INSURANCE | Admitting: Physician Assistant

## 2019-11-10 DIAGNOSIS — J029 Acute pharyngitis, unspecified: Secondary | ICD-10-CM | POA: Diagnosis not present

## 2019-11-10 MED ORDER — AMOXICILLIN 500 MG PO CAPS: 500.0000 mg | ORAL_CAPSULE | Freq: Two times a day (BID) | ORAL | 0 refills | Status: AC

## 2019-11-10 NOTE — Progress Notes (Signed)

## 2020-10-23 ENCOUNTER — Telehealth: Payer: PRIVATE HEALTH INSURANCE | Admitting: Nurse Practitioner

## 2020-10-23 DIAGNOSIS — J340 Abscess, furuncle and carbuncle of nose: Secondary | ICD-10-CM | POA: Diagnosis not present

## 2020-10-23 MED ORDER — DOXYCYCLINE HYCLATE 100 MG PO TABS: 100.0000 mg | ORAL_TABLET | Freq: Two times a day (BID) | ORAL | 0 refills | Status: AC

## 2020-10-23 NOTE — Progress Notes (Signed)
E Visit for Cellulitis  We are sorry that you are not feeling well. Here is how we plan to help!  Based on what you shared with me it looks like you have cellulitis.  Cellulitis looks like areas of skin redness, swelling, and warmth; it develops as a result of bacteria entering under the skin. Little red spots and/or bleeding can be seen in skin, and tiny surface sacs containing fluid can occur. Fever can be present. Cellulitis is almost always on one side of a body, and the lower limbs are the most common site of involvement.   I have prescribed:  Doxycycline 100mg  1 pn G+BID #20 0 refills  HOME CARE:  . Take your medications as ordered and take all of them, even if the skin irritation appears to be healing.   GET HELP RIGHT AWAY IF:  . Symptoms that don't begin to go away within 48 hours. . Severe redness persists or worsens . If the area turns color, spreads or swells. . If it blisters and opens, develops yellow-brown crust or bleeds. . You develop a fever or chills. . If the pain increases or becomes unbearable.  . Are unable to keep fluids and food down.  MAKE SURE YOU    Understand these instructions.  Will watch your condition.  Will get help right away if you are not doing well or get worse.  Thank you for choosing an e-visit. Your e-visit answers were reviewed by a board certified advanced clinical practitioner to complete your personal care plan. Depending upon the condition, your plan could have included both over the counter or prescription medications. Please review your pharmacy choice. Make sure the pharmacy is open so you can pick up prescription now. If there is a problem, you may contact your provider through and have the prescription routed to another pharmacy. Your safety is important to [employer]. If you have drug allergies check your prescription carefully.  For the next 24 hours you can use MyChart to ask questions about today's visit, request a  non-urgent call back, or ask for a work or school excuse. You will get an email in the next two days asking about your experience. I hope that your e-visit has been valuable and will speed your recovery.  5-10 minutes spent reviewing and documenting in chart.

## 2020-10-23 NOTE — Addendum Note (Signed)
Addended by: Bennie Pierini on: 10/23/2020 07:57 PM   Modules accepted: Orders

## 2021-01-10 ENCOUNTER — Other Ambulatory Visit: Payer: Self-pay

## 2021-01-10 DIAGNOSIS — I83899 Varicose veins of unspecified lower extremities with other complications: Secondary | ICD-10-CM

## 2021-01-17 ENCOUNTER — Other Ambulatory Visit: Payer: Self-pay

## 2021-01-17 ENCOUNTER — Ambulatory Visit (HOSPITAL_COMMUNITY)
Admission: RE | Admit: 2021-01-17 | Discharge: 2021-01-17 | Disposition: A | Discharge status: Home / Self Care | Payer: No Typology Code available for payment source | Source: Ambulatory Visit | Attending: Vascular Surgery | Admitting: Vascular Surgery

## 2021-01-17 DIAGNOSIS — I83892 Varicose veins of left lower extremities with other complications: Secondary | ICD-10-CM | POA: Diagnosis not present

## 2021-01-17 DIAGNOSIS — I83899 Varicose veins of unspecified lower extremities with other complications: Secondary | ICD-10-CM | POA: Insufficient documentation

## 2021-01-22 ENCOUNTER — Other Ambulatory Visit: Payer: Self-pay

## 2021-01-22 ENCOUNTER — Ambulatory Visit (INDEPENDENT_AMBULATORY_CARE_PROVIDER_SITE_OTHER): Payer: No Typology Code available for payment source | Admitting: Vascular Surgery

## 2021-01-22 ENCOUNTER — Encounter: Payer: Self-pay | Admitting: Vascular Surgery

## 2021-01-22 DIAGNOSIS — I872 Venous insufficiency (chronic) (peripheral): Secondary | ICD-10-CM | POA: Diagnosis not present

## 2021-01-22 NOTE — Progress Notes (Signed)
Patient name: [name] MRN: [id number] DOB: 05/22/1975 Sex: male  REASON FOR CONSULT: Evaluate varicose veins and venous insufficiency of bilateral lower extremities  HPI: [name] is a 45 y.o. male, with no significant past medical history that presents for evaluation of superficial thrombophlebitis and underlying venous insufficiency.  He initially had superficial venous thrombosis in the right leg after a snowboarding accident about 10 years ago and then had a subsequent event after that again in the right leg.  Most recently had a left leg event where he had a palpable cord several months ago along the great saphenous vein in the thigh and calf.  He may have had an injury playing pickle ball at the time of this SVT.  He did take 1 month of Eliquis 2.5 mg BID prescribed by PCP.  He since stopped his Eliquis several months ago with no recurrent symptoms.  He has been taking an aspirin daily and wearing knee-high compression stockings.  He has had almost complete resolution of his left leg symptoms.  His only other complaint has been leg swelling which is managed with compression on a daily basis.  He has no history of DVT.  No significant family history except his mother who had a blood clot associated with an active malignancy.  Past Medical History:  Diagnosis Date   Pleural effusion     Past Surgical History:  Procedure Laterality Date   VASECTOMY     2012   VIDEO ASSISTED THORACOSCOPY (VATS)/DECORTICATION Right 10/28/2013   Procedure: VIDEO ASSISTED THORACOSCOPY (VATS)/DECORTICATION;  Surgeon: Kerin Perna, MD;  Location: [facility] OR;  Service: Thoracic;  Laterality: Right;  EPIDURAL ANESTHESIA PER DR VANTRIGT   VIDEO BRONCHOSCOPY N/A 10/28/2013   Procedure: VIDEO BRONCHOSCOPY;  Surgeon: Kerin Perna, MD;  Location: [facility] OR;  Service: Thoracic;  Laterality: N/A;   WEDGE RESECTION Right 10/28/2013   Procedure: WEDGE RESECTION RIGHT LOWER LOBE;  Surgeon: Kerin Perna, MD;  Location:  [facility] OR;  Service: Thoracic;  Laterality: Right;    Family History  Problem Relation Age of Onset   Sudden death Neg Hx    Heart attack Neg Hx    Hyperlipidemia Neg Hx    Hypertension Neg Hx    Diabetes Neg Hx    Breast cancer Maternal Grandmother    Liver disease Paternal Grandfather    Cancer Mother     SOCIAL HISTORY: Social History   Socioeconomic History   Marital status: Married    Spouse name: Not on file   Number of children: Not on file   Years of education: Not on file   Highest education level: Not on file  Occupational History   Not on file  Tobacco Use   Smoking status: Never   Smokeless tobacco: Never  Substance and Sexual Activity   Alcohol use: No   Drug use: No   Sexual activity: Not on file  Other Topics Concern   Not on file  Social History Narrative   Not on file   Social Determinants of Health   Financial Resource Strain: Not on file  Food Insecurity: Not on file  Transportation Needs: Not on file  Physical Activity: Not on file  Stress: Not on file  Social Connections: Not on file  Intimate Partner Violence: Not on file    No Known Allergies  Current Outpatient Medications  Medication Sig Dispense Refill   aspirin EC 81 MG tablet Take 81 mg by mouth daily. Swallow whole.  cetirizine (ZYRTEC) 10 MG tablet Take 10 mg by mouth daily as needed for allergies.  (Patient not taking: Reported on 01/22/2021)     cetirizine (ZYRTEC) 10 MG tablet Take by mouth. (Patient not taking: Reported on 01/22/2021)     doxycycline (VIBRA-TABS) 100 MG tablet Take 1 tablet (100 mg total) by mouth 2 (two) times daily. 1 po bid (Patient not taking: Reported on 01/22/2021) 20 tablet 0   ibuprofen (ADVIL,MOTRIN) 200 MG tablet Take 200-800 mg by mouth every 6 (six) hours as needed (for pain). (Patient not taking: Reported on 01/22/2021)     ondansetron (ZOFRAN) 4 MG tablet Take 1 tablet (4 mg total) by mouth every 8 (eight) hours as needed for nausea or vomiting. 10  tablet 0   Ranitidine HCl (ZANTAC PO) Take 1 tablet by mouth daily as needed (for heart burn).      zolpidem (AMBIEN) 10 MG tablet Take 10 mg by mouth at bedtime as needed for sleep.     No current facility-administered medications for this visit.    REVIEW OF SYSTEMS:   denotes positive finding,  denotes negative finding Cardiac  Comments:  Chest pain or chest pressure:    Shortness of breath upon exertion:    Short of breath when lying flat:    Irregular heart rhythm:        Vascular    Pain in calf, thigh, or hip brought on by ambulation:    Pain in feet at night that wakes you up from your sleep:     Blood clot in your veins:    Leg swelling:  x       Pulmonary    Oxygen at home:    Productive cough:     Wheezing:         Neurologic    Sudden weakness in arms or legs:     Sudden numbness in arms or legs:     Sudden onset of difficulty speaking or slurred speech:    Temporary loss of vision in one eye:     Problems with dizziness:         Gastrointestinal    Blood in stool:     Vomited blood:         Genitourinary    Burning when urinating:     Blood in urine:        Psychiatric    Major depression:         Hematologic    Bleeding problems:    Problems with blood clotting too easily:        Skin    Rashes or ulcers:        Constitutional    Fever or chills:      PHYSICAL EXAM: Vitals:   01/22/21 1603  BP: 128/71  Pulse: 65  Resp: 18  Temp: 97.9 F (36.6 C)  TempSrc: Temporal  SpO2: 96%  Weight: 231 lb (104.8 kg)  Height:  (1.88 m)    GENERAL: The patient is a well-nourished male, in no acute distress. The vital signs are documented above. CARDIAC: There is a regular rate and rhythm.  VASCULAR:  Palpable DP PT pulses bilateral Notable varicosity right medial proximal calf as well as left shin No skin thickeing of pigmentation of lower extremities PULMONARY: No respiratory distress MUSCULOSKELETAL: There are no major deformities  or cyanosis. NEUROLOGIC: No focal weakness or paresthesias are detected. SKIN: There are no ulcers or rashes noted. PSYCHIATRIC: The patient has a  normal affect.  DATA:   Lower Venous Reflux Study   Patient Name:  ORAZIO WELLER  Date of Exam:   01/17/2021  Medical Rec #: [id number]     Accession #:    [id number]  Date of Birth: Jun 26, 1975      Patient Gender: M  Patient Age:   67 years  Exam Location:  [facility]  Procedure:      VAS [place] LOWER EXTREMITY VENOUS REFLUX  Referring Phys: Sherald Hess    ---------------------------------------------------------------------------  -----     Indications: Varicosities, and known superficial thrombosis.      Comparison Study: None   Performing Technologist: Ethelle Lyon      Examination Guidelines: A complete evaluation includes B-mode imaging,  spectral  Doppler, color Doppler, and power Doppler as needed of all accessible  portions  of each vessel. Bilateral testing is considered an integral part of a  complete  examination. Limited examinations for reoccurring indications may be  performed  as noted. The reflux portion of the exam is performed with the patient in  reverse Trendelenburg.  Significant venous reflux is defined as >500 ms in the superficial venous  system, and >1 second in the deep venous system.      +------------------+---------+------+-----------+------------+-------------  ----+  LEFT              Reflux NoRefluxReflux TimeDiameter cmsComments                                        Yes                                             +------------------+---------+------+-----------+------------+-------------  ----+  CFV                         yes   >1 second                                 +------------------+---------+------+-----------+------------+-------------  ----+  FV mid            no                                                         +------------------+---------+------+-----------+------------+-------------  ----+  Popliteal         no                                                        +------------------+---------+------+-----------+------------+-------------  ----+  GSV at [facility]                  yes    >500 ms      0.74                        +------------------+---------+------+-----------+------------+-------------  ----+  GSV prox thigh  yes    >500 ms      0.58    thrombus            +------------------+---------+------+-----------+------------+-------------  ----+  GSV mid thigh                                   0.52    thrombus            +------------------+---------+------+-----------+------------+-------------  ----+  GSV dist thigh              yes    >500 ms      0.43    thrombus;  large                                                            thrombosed,                                                                  superficial                                                                  branch              +------------------+---------+------+-----------+------------+-------------  ----+  GSV at knee                 yes    >500 ms      0.38    thrombus            +------------------+---------+------+-----------+------------+-------------  ----+  GSV prox calf               yes    >500 ms      0.46    thrombus            +------------------+---------+------+-----------+------------+-------------  ----+  GSV mid calf                                            thrombus            +------------------+---------+------+-----------+------------+-------------  ----+  GSV dist calf                                           thrombus            +------------------+---------+------+-----------+------------+-------------  ----+  SSV Pop Fossa     no                            0.26                         +------------------+---------+------+-----------+------------+-------------  ----+  SSV prox calf     no                            0.22                        +------------------+---------+------+-----------+------------+-------------  ----+  SSV mid calf                yes    >500 ms      0.36    thrombus            +------------------+---------+------+-----------+------------+-------------  ----+  Distal calf                 yes    >500 ms                                  perforator                                                                  +------------------+---------+------+-----------+------------+-------------  ----+           Summary:  Left:  - No evidence of deep vein thrombosis seen in the left lower extremity,  from the common femoral through the popliteal veins.  - Color duplex evaluation of the left lower extremity shows there is  thrombus in the proximal greater saphenous vein, distal greater saphenous  vein and lesser saphenous vein.     - Venous reflux is noted in the left common femoral vein.  - Venous reflux is noted in the left sapheno-femoral junction.  - Venous reflux is noted in the left greater saphenous vein in the thigh.  - Venous reflux is noted in the left greater saphenous vein in the calf.  - Venous reflux is noted in the left short saphenous vein.  - Venous reflux is noted in the left perforator vein.     *See table(s) above for measurements and observations.   Electronically signed by Waverly Ferrarihristopher Dickson MD on 01/17/2021 at 3:44:19 PM.      Assessment/Plan:  45 year old male presents for evaluation of superficial venous thrombosis with possible underlying venous insufficiency.  His duplex shows long segment great saphenous vein thrombus from the proximal thigh into the distal calf.  He did take 1 month of Eliquis when this first happened and has since transitioned to an aspirin daily over the  last several months with near complete resolution of his symptoms.   He has had no recurrent symptoms and his leg swelling is very well managed with compression stockings.  I do not see an indication for additional anticoagulation at this time and I think 81 mg aspirin daily is more than appropriate.  In addition, I reviewed that his left leg reflux study and discussed it shows evidence of deep and superficial reflux.  I do not feel strongly he is a surgical candidate for much in the left leg at this time given the great saphenous vein is already thrombosed.  His symptoms are well managed with compression stockings and I think with exercise leg elevation and compression stockings it would would be more than appropriate to continue conservative  therapy.  I asked that he let me know if he has any recurrent symptoms in the future.  We can always re-evaluate him in the future.  He does not feel his right leg is symptomatic enough to warrant additional work-up at this time with reflux study etc.  Cephus Shelling, MD [facility] [place]: [phone]

## 2021-02-01 ENCOUNTER — Other Ambulatory Visit: Payer: Self-pay

## 2021-02-01 ENCOUNTER — Ambulatory Visit (INDEPENDENT_AMBULATORY_CARE_PROVIDER_SITE_OTHER): Payer: No Typology Code available for payment source | Admitting: Physician Assistant

## 2021-02-01 ENCOUNTER — Encounter (HOSPITAL_COMMUNITY): Payer: No Typology Code available for payment source

## 2021-02-01 ENCOUNTER — Other Ambulatory Visit (HOSPITAL_COMMUNITY): Payer: Self-pay | Admitting: Vascular Surgery

## 2021-02-01 ENCOUNTER — Ambulatory Visit (HOSPITAL_COMMUNITY)
Admission: RE | Admit: 2021-02-01 | Discharge: 2021-02-01 | Disposition: A | Discharge status: Home / Self Care | Payer: No Typology Code available for payment source | Source: Ambulatory Visit | Attending: Vascular Surgery | Admitting: Vascular Surgery

## 2021-02-01 VITALS — BP 120/79 | HR 55 | Temp 97.8°F | Resp 18

## 2021-02-01 DIAGNOSIS — M79605 Pain in left leg: Secondary | ICD-10-CM | POA: Insufficient documentation

## 2021-02-01 DIAGNOSIS — I872 Venous insufficiency (chronic) (peripheral): Secondary | ICD-10-CM | POA: Diagnosis not present
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
## 2024-06-16 ENCOUNTER — Other Ambulatory Visit: Payer: Self-pay | Admitting: Vascular Surgery

## 2024-06-16 DIAGNOSIS — I872 Venous insufficiency (chronic) (peripheral): Secondary | ICD-10-CM

## 2024-07-12 ENCOUNTER — Encounter: Admitting: Vascular Surgery

## 2024-07-12 ENCOUNTER — Ambulatory Visit (HOSPITAL_COMMUNITY)
